# Patient Record
Sex: Male | Born: 1955 | Race: White | Hispanic: No | Marital: Married | Smoking: Former smoker
Health system: Southern US, Community
[De-identification: ages and names within clinical notes are randomized; demographics above are authoritative.]

## PROBLEM LIST (undated history)

## (undated) DIAGNOSIS — E079 Disorder of thyroid, unspecified: Secondary | ICD-10-CM

## (undated) DIAGNOSIS — E039 Hypothyroidism, unspecified: Secondary | ICD-10-CM

## (undated) DIAGNOSIS — M48061 Spinal stenosis, lumbar region without neurogenic claudication: Secondary | ICD-10-CM

## (undated) DIAGNOSIS — I2510 Atherosclerotic heart disease of native coronary artery without angina pectoris: Secondary | ICD-10-CM

## (undated) DIAGNOSIS — I219 Acute myocardial infarction, unspecified: Secondary | ICD-10-CM

## (undated) DIAGNOSIS — I10 Essential (primary) hypertension: Secondary | ICD-10-CM

## (undated) DIAGNOSIS — Z974 Presence of external hearing-aid: Secondary | ICD-10-CM

## (undated) DIAGNOSIS — T8859XA Other complications of anesthesia, initial encounter: Secondary | ICD-10-CM

## (undated) DIAGNOSIS — Z972 Presence of dental prosthetic device (complete) (partial): Secondary | ICD-10-CM

## (undated) DIAGNOSIS — T4145XA Adverse effect of unspecified anesthetic, initial encounter: Secondary | ICD-10-CM

## (undated) DIAGNOSIS — M1990 Unspecified osteoarthritis, unspecified site: Secondary | ICD-10-CM

## (undated) DIAGNOSIS — Z862 Personal history of diseases of the blood and blood-forming organs and certain disorders involving the immune mechanism: Secondary | ICD-10-CM

## (undated) HISTORY — PX: CORONARY ANGIOPLASTY WITH STENT PLACEMENT: SHX49

## (undated) HISTORY — PX: APPENDECTOMY: SHX54

## (undated) HISTORY — DX: Atherosclerotic heart disease of native coronary artery without angina pectoris: I25.10

---
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
## 1988-12-09 HISTORY — PX: SPLENECTOMY: SUR1306

## 1998-12-09 DIAGNOSIS — E0590 Thyrotoxicosis, unspecified without thyrotoxic crisis or storm: Secondary | ICD-10-CM

## 1998-12-09 HISTORY — DX: Thyrotoxicosis, unspecified without thyrotoxic crisis or storm: E05.90

## 2010-12-09 HISTORY — PX: CORONARY ANGIOPLASTY WITH STENT PLACEMENT: SHX49

## 2012-02-28 DIAGNOSIS — E785 Hyperlipidemia, unspecified: Secondary | ICD-10-CM | POA: Insufficient documentation

## 2012-11-02 DIAGNOSIS — M1712 Unilateral primary osteoarthritis, left knee: Secondary | ICD-10-CM | POA: Insufficient documentation

## 2012-11-02 DIAGNOSIS — M1710 Unilateral primary osteoarthritis, unspecified knee: Secondary | ICD-10-CM | POA: Insufficient documentation

## 2012-12-22 DIAGNOSIS — R079 Chest pain, unspecified: Secondary | ICD-10-CM | POA: Insufficient documentation

## 2014-01-04 DIAGNOSIS — Z96652 Presence of left artificial knee joint: Secondary | ICD-10-CM | POA: Insufficient documentation

## 2014-09-01 DIAGNOSIS — I2510 Atherosclerotic heart disease of native coronary artery without angina pectoris: Secondary | ICD-10-CM | POA: Insufficient documentation

## 2014-09-01 DIAGNOSIS — D693 Immune thrombocytopenic purpura: Secondary | ICD-10-CM | POA: Insufficient documentation

## 2014-09-01 DIAGNOSIS — N400 Enlarged prostate without lower urinary tract symptoms: Secondary | ICD-10-CM | POA: Insufficient documentation

## 2015-05-12 DIAGNOSIS — I83892 Varicose veins of left lower extremities with other complications: Secondary | ICD-10-CM | POA: Insufficient documentation

## 2015-09-27 DIAGNOSIS — D126 Benign neoplasm of colon, unspecified: Secondary | ICD-10-CM | POA: Insufficient documentation

## 2016-02-25 ENCOUNTER — Emergency Department
Admission: EM | Admit: 2016-02-25 | Discharge: 2016-02-26 | Disposition: A | Discharge status: Home / Self Care | Payer: PRIVATE HEALTH INSURANCE | Attending: Emergency Medicine | Admitting: Emergency Medicine

## 2016-02-25 ENCOUNTER — Emergency Department: Payer: PRIVATE HEALTH INSURANCE

## 2016-02-25 DIAGNOSIS — E079 Disorder of thyroid, unspecified: Secondary | ICD-10-CM | POA: Insufficient documentation

## 2016-02-25 DIAGNOSIS — Z8674 Personal history of sudden cardiac arrest: Secondary | ICD-10-CM | POA: Insufficient documentation

## 2016-02-25 DIAGNOSIS — Z79899 Other long term (current) drug therapy: Secondary | ICD-10-CM | POA: Insufficient documentation

## 2016-02-25 DIAGNOSIS — R079 Chest pain, unspecified: Secondary | ICD-10-CM | POA: Insufficient documentation

## 2016-02-25 DIAGNOSIS — F17210 Nicotine dependence, cigarettes, uncomplicated: Secondary | ICD-10-CM | POA: Diagnosis not present

## 2016-02-25 DIAGNOSIS — I10 Essential (primary) hypertension: Secondary | ICD-10-CM | POA: Diagnosis not present

## 2016-02-25 DIAGNOSIS — Z7982 Long term (current) use of aspirin: Secondary | ICD-10-CM | POA: Insufficient documentation

## 2016-02-25 DIAGNOSIS — Z955 Presence of coronary angioplasty implant and graft: Secondary | ICD-10-CM | POA: Diagnosis not present

## 2016-02-25 HISTORY — DX: Disorder of thyroid, unspecified: E07.9

## 2016-02-25 HISTORY — DX: Essential (primary) hypertension: I10

## 2016-02-25 LAB — CBC
HCT: 43.5 % (ref 40.0–52.0)
HEMOGLOBIN: 15.1 g/dL (ref 13.0–18.0)
MCH: 30.3 pg (ref 26.0–34.0)
MCHC: 34.7 g/dL (ref 32.0–36.0)
MCV: 87.4 fL (ref 80.0–100.0)
PLATELETS: 261 10*3/uL (ref 150–440)
RBC: 4.98 MIL/uL (ref 4.40–5.90)
RDW: 14.8 % — AB (ref 11.5–14.5)
WBC: 8.6 10*3/uL (ref 3.8–10.6)

## 2016-02-25 LAB — BASIC METABOLIC PANEL
Anion gap: 4 — ABNORMAL LOW (ref 5–15)
BUN: 16 mg/dL (ref 6–20)
CALCIUM: 9 mg/dL (ref 8.9–10.3)
CO2: 26 mmol/L (ref 22–32)
CREATININE: 1.25 mg/dL — AB (ref 0.61–1.24)
Chloride: 107 mmol/L (ref 101–111)
GFR calc Af Amer: >60 mL/min (ref >60)
GLUCOSE: 117 mg/dL — AB (ref 65–99)
Potassium: 3.6 mmol/L (ref 3.5–5.1)
Sodium: 137 mmol/L (ref 135–145)

## 2016-02-25 LAB — TROPONIN I

## 2016-02-25 LAB — PROTIME-INR
INR: 1.04
PROTHROMBIN TIME: 13.8 s (ref 11.4–15.0)

## 2016-02-25 NOTE — ED Notes (Signed)
Pt arrived from home via EMS c/o chest pain. Per EMS pt has hx of cardiac stent, and today was experiencing mid sternal chest pain that radiates to both arm, pt reported taking 3 nitro ans 2 baby aspirins with some relief. EMS reported giving 2 additional baby aspirins as well as 1 inch of nitro pace to the lower left abdomen.

## 2016-02-26 LAB — TROPONIN I

## 2016-02-26 NOTE — Discharge Instructions (Signed)
Given your previous heart attack history the recommendation would be for the patient to stay in the hospital. Please follow up with your cardiologist first thing in the morning for further evaluation. Please discuss with your cardiologist starting IMDUR as a medication to help with your chest pain.   Nonspecific Chest Pain  Chest pain can be caused by many different conditions. There is always a chance that your pain could be related to something serious, such as a heart attack or a blood clot in your lungs. Chest pain can also be caused by conditions that are not life-threatening. If you have chest pain, it is very important to follow up with your health care provider. CAUSES  Chest pain can be caused by:  Heartburn.  Pneumonia or bronchitis.  Anxiety or stress.  Inflammation around your heart (pericarditis) or lung (pleuritis or pleurisy).  A blood clot in your lung.  A collapsed lung (pneumothorax). It can develop suddenly on its own (spontaneous pneumothorax) or from trauma to the chest.  Shingles infection (varicella-zoster virus).  Heart attack.  Damage to the bones, muscles, and cartilage that make up your chest wall. This can include:  Bruised bones due to injury.  Strained muscles or cartilage due to frequent or repeated coughing or overwork.  Fracture to one or more ribs.  Sore cartilage due to inflammation (costochondritis). RISK FACTORS  Risk factors for chest pain may include:  Activities that increase your risk for trauma or injury to your chest.  Respiratory infections or conditions that cause frequent coughing.  Medical conditions or overeating that can cause heartburn.  Heart disease or family history of heart disease.  Conditions or health behaviors that increase your risk of developing a blood clot.  Having had chicken pox (varicella zoster). SIGNS AND SYMPTOMS Chest pain can feel like:  Burning or tingling on the surface of your chest or deep in  your chest.  Crushing, pressure, aching, or squeezing pain.  Dull or sharp pain that is worse when you move, cough, or take a deep breath.  Pain that is also felt in your back, neck, shoulder, or arm, or pain that spreads to any of these areas. Your chest pain may come and go, or it may stay constant. DIAGNOSIS Lab tests or other studies may be needed to find the cause of your pain. Your health care provider may have you take a test called an ambulatory ECG (electrocardiogram). An ECG records your heartbeat patterns at the time the test is performed. You may also have other tests, such as:  Transthoracic echocardiogram (TTE). During echocardiography, sound waves are used to create a picture of all of the heart structures and to look at how blood flows through your heart.  Transesophageal echocardiogram (TEE).This is a more advanced imaging test that obtains images from inside your body. It allows your health care provider to see your heart in finer detail.  Cardiac monitoring. This allows your health care provider to monitor your heart rate and rhythm in real time.  Holter monitor. This is a portable device that records your heartbeat and can help to diagnose abnormal heartbeats. It allows your health care provider to track your heart activity for several days, if needed.  Stress tests. These can be done through exercise or by taking medicine that makes your heart beat more quickly.  Blood tests.  Imaging tests. TREATMENT  Your treatment depends on what is causing your chest pain. Treatment may include:  Medicines. These may include:  Acid blockers  for heartburn.  Anti-inflammatory medicine.  Pain medicine for inflammatory conditions.  Antibiotic medicine, if an infection is present.  Medicines to dissolve blood clots.  Medicines to treat coronary artery disease.  Supportive care for conditions that do not require medicines. This may include:  Resting.  Applying heat or  cold packs to injured areas.  Limiting activities until pain decreases. HOME CARE INSTRUCTIONS  If you were prescribed an antibiotic medicine, finish it all even if you start to feel better.  Avoid any activities that bring on chest pain.  Do not use any tobacco products, including cigarettes, chewing tobacco, or electronic cigarettes. If you need help quitting, ask your health care provider.  Do not drink alcohol.  Take medicines only as directed by your health care provider.  Keep all follow-up visits as directed by your health care provider. This is important. This includes any further testing if your chest pain does not go away.  If heartburn is the cause for your chest pain, you may be told to keep your head raised (elevated) while sleeping. This reduces the chance that acid will go from your stomach into your esophagus.  Make lifestyle changes as directed by your health care provider. These may include:  Getting regular exercise. Ask your health care provider to suggest some activities that are safe for you.  Eating a heart-healthy diet. A registered dietitian can help you to learn healthy eating options.  Maintaining a healthy weight.  Managing diabetes, if necessary.  Reducing stress. SEEK MEDICAL CARE IF:  Your chest pain does not go away after treatment.  You have a rash with blisters on your chest.  You have a fever. SEEK IMMEDIATE MEDICAL CARE IF:   Your chest pain is worse.  You have an increasing cough, or you cough up blood.  You have severe abdominal pain.  You have severe weakness.  You faint.  You have chills.  You have sudden, unexplained chest discomfort.  You have sudden, unexplained discomfort in your arms, back, neck, or jaw.  You have shortness of breath at any time.  You suddenly start to sweat, or your skin gets clammy.  You feel nauseous or you vomit.  You suddenly feel light-headed or dizzy.  Your heart begins to beat quickly,  or it feels like it is skipping beats. These symptoms may represent a serious problem that is an emergency. Do not wait to see if the symptoms will go away. Get medical help right away. Call your local emergency services (911 in the [place] Do not drive yourself to the hospital.   This information is not intended to replace advice given to you by your health care provider. Make sure you discuss any questions you have with your health care provider.   Document Released: 09/04/2005 Document Revised: 12/16/2014 Document Reviewed: 07/01/2014 Elsevier Interactive Patient Education [employer].

## 2016-02-26 NOTE — ED Provider Notes (Signed)
[facility] Emergency Department Provider Note  ____________________________________________  Time seen: Approximately 2309 PM  I have reviewed the triage vital signs and the nursing notes.   HISTORY  Chief Complaint Chest Pain    HPI Jonanthan Andreola is a 60 y.o. male who comes into the hospital today with chest pain. The patient reports that the pain started around 7 PM. He was washing dishes when he started having some pressure in his chest and intense pain. He reports it was in his mid chest that went down his arms. He took 2 nitroglycerin as well as 281 mg aspirin. He reports that the pain eased but then he called EMS to get checked out. They did an EKG and he decided not to come into the hospital but 10 minutes later the pain started again. The patient took another nitroglycerin and decided to come into the hospital. EMS gave him 2 more 81 mg aspirin and put nitroglycerin paste on his abdomen. He reports that the pain is currently gone. The patient has had 2 previous MIs so he was concerned about the symptoms. His cardiologist is in [place]. He denies any nausea or vomiting no sweats no shortness of breath. He does have some mild shortness of breath but denies any pain when he takes a deep breath in. The patient's family were concerned about a heart attack and wanted him checked out.   Past Medical History  Diagnosis Date  . Hypertension   . Thyroid disease     There are no active problems to display for this patient.   Past Surgical History  Procedure Laterality Date  . Appendectomy    . Coronary angioplasty with stent placement      Current Outpatient Rx  Name  Route  Sig  Dispense  Refill  . clopidogrel (PLAVIX) 75 MG tablet   Oral   Take 1 tablet by mouth daily.      11   . doxazosin (CARDURA) 8 MG tablet   Oral   Take 1 tablet by mouth daily.      3   . levothyroxine (SYNTHROID, LEVOTHROID) 200 MCG tablet   Oral   Take 1 tablet by  mouth daily.      1   . lisinopril (PRINIVIL,ZESTRIL) 40 MG tablet   Oral   Take 1 tablet by mouth daily.      3   . metoprolol succinate (TOPROL-XL) 50 MG 24 hr tablet   Oral   Take 1 tablet by mouth daily.      6   . simvastatin (ZOCOR) 80 MG tablet   Oral   Take 0.5 tablets by mouth daily.      5   . spironolactone (ALDACTONE) 25 MG tablet   Oral   Take 1 tablet by mouth daily.      2   . traZODone (DESYREL) 50 MG tablet   Oral   Take 0.5 tablets by mouth daily.      11     Allergies Review of patient's allergies indicates no known allergies.  Family History  Problem Relation Age of Onset  . Stroke Mother   . Cancer Father     Social History Social History  Substance Use Topics  . Smoking status: Current Every Day Smoker -- 0.50 packs/day  . Smokeless tobacco: None  . Alcohol Use: No     Comment: rarely drinks    Review of Systems Constitutional: No fever/chills Eyes: No visual changes. ENT: No  sore throat. Cardiovascular:  chest pain. Respiratory: shortness of breath. Gastrointestinal: No abdominal pain.  No nausea, no vomiting.  No diarrhea.  No constipation. Genitourinary: Negative for dysuria. Musculoskeletal: Negative for back pain. Skin: Negative for rash. Neurological: Negative for headaches, focal weakness or numbness.  10-point ROS otherwise negative.  ____________________________________________   PHYSICAL EXAM:  VITAL SIGNS: ED Triage Vitals  Enc Vitals Group     BP 02/25/16 2130 138/71 mmHg     Pulse Rate 02/25/16 2109 65     Resp 02/25/16 2109 12     Temp 02/25/16 2109 98.4 F (36.9 C)     Temp src --      SpO2 02/25/16 2103 97 %     Weight 02/25/16 2109 272 lb (123.378 kg)     Height 02/25/16 2109 6\' 7"  (2.007 m)     Head Cir --      Peak Flow --      Pain Score 02/25/16 2112 1     Pain Loc --      Pain Edu? --      Excl. in [place]? --     Constitutional: Alert and oriented. Well appearing and in no acute  distress. Eyes: Conjunctivae are normal. PERRL. EOMI. Head: Atraumatic. Nose: No congestion/rhinnorhea. Mouth/Throat: Mucous membranes are moist.  Oropharynx non-erythematous. Cardiovascular: Normal rate, regular rhythm. Grossly normal heart sounds.  Good peripheral circulation. Respiratory: Normal respiratory effort.  No retractions. Lungs CTAB. Gastrointestinal: Soft and nontender. No distention. Positive bowel sounds Musculoskeletal: No lower extremity tenderness nor edema.   Neurologic:  Normal speech and language.  Skin:  Skin is warm, dry and intact.  Psychiatric: Mood and affect are normal.   ____________________________________________   LABS (all labs ordered are listed, but only abnormal results are displayed)  Labs Reviewed  BASIC METABOLIC PANEL - Abnormal; Notable for the following:    Glucose, Bld 117 (*)    Creatinine, Ser 1.25 (*)    Anion gap 4 (*)    All other components within normal limits  CBC - Abnormal; Notable for the following:    RDW 14.8 (*)    All other components within normal limits  TROPONIN I  PROTIME-INR  TROPONIN I   ____________________________________________  EKG  ED ECG REPORT I, Loney Hering, the attending physician, personally viewed and interpreted this ECG.   Date: 03/06/2016  EKG Time: 2109   Rate: 68  Rhythm: normal sinus rhythm  Axis: Left axis deviation  Intervals:none  ST&T Change: None  ____________________________________________  RADIOLOGY  Chest x-ray one emphysematous changes and chronic bronchitic changes in the lungs, no evidence of active pulmonary disease. ____________________________________________   PROCEDURES  Procedure(s) performed: None  Critical Care performed: No  ____________________________________________   INITIAL IMPRESSION / ASSESSMENT AND PLAN / ED COURSE  Pertinent labs & imaging results that were available during my care of the patient were reviewed by me and considered in  my medical decision making (see chart for details).  This is a 60 year old male with a history of previous heart attack who comes into the hospital today with some chest pain. The patient's pain started at 7 when away after some nitroglycerin but came back. He does have a Nitropaste on it this time. The patient's initial blood work is unremarkable and I did inform him given his history and his previous heart attacks I would consider admitting him to the hospital. The patient reports at this point he really doesn't want to be admitted. We will  continue with the second troponin at this time and then determine the patient's appropriate disposition. He reports that he will think about being admitted to the hospital.  The patient's repeat troponin was unremarkable. I discussed again with the patient staying in the hospital but he reports that he would be more comfortable going home and he would rather follow-up with his cardiologist. The patient will have his Nitropaste removed and he'll be discharged to home. I did encourage him that should he have any more pain he should return he would need to be admitted at that time. The patient be discharged home. ____________________________________________   FINAL CLINICAL IMPRESSION(S) / ED DIAGNOSES  Final diagnoses:  Chest pain, unspecified chest pain type      Loney Hering, MD 02/26/16 [phone]

## 2016-08-27 DIAGNOSIS — D229 Melanocytic nevi, unspecified: Secondary | ICD-10-CM | POA: Insufficient documentation

## 2017-04-14 DIAGNOSIS — Z85828 Personal history of other malignant neoplasm of skin: Secondary | ICD-10-CM | POA: Insufficient documentation

## 2017-04-14 DIAGNOSIS — E669 Obesity, unspecified: Secondary | ICD-10-CM | POA: Insufficient documentation

## 2017-12-05 DIAGNOSIS — Z955 Presence of coronary angioplasty implant and graft: Secondary | ICD-10-CM | POA: Insufficient documentation

## 2018-03-04 DIAGNOSIS — G5603 Carpal tunnel syndrome, bilateral upper limbs: Secondary | ICD-10-CM | POA: Insufficient documentation

## 2018-06-09 DIAGNOSIS — J3489 Other specified disorders of nose and nasal sinuses: Secondary | ICD-10-CM | POA: Insufficient documentation

## 2018-07-10 DIAGNOSIS — G5623 Lesion of ulnar nerve, bilateral upper limbs: Secondary | ICD-10-CM | POA: Insufficient documentation

## 2018-09-04 HISTORY — PX: SEPTOPLASTY: SUR1290

## 2018-09-14 DIAGNOSIS — M1612 Unilateral primary osteoarthritis, left hip: Secondary | ICD-10-CM | POA: Insufficient documentation

## 2019-01-26 DIAGNOSIS — J0101 Acute recurrent maxillary sinusitis: Secondary | ICD-10-CM | POA: Insufficient documentation

## 2019-05-04 DIAGNOSIS — Z9081 Acquired absence of spleen: Secondary | ICD-10-CM | POA: Insufficient documentation

## 2020-01-13 ENCOUNTER — Encounter: Payer: Self-pay | Admitting: Gastroenterology

## 2020-01-13 ENCOUNTER — Other Ambulatory Visit: Payer: Self-pay

## 2020-01-13 ENCOUNTER — Ambulatory Visit: Payer: Medicare Other | Admitting: Gastroenterology

## 2020-01-13 VITALS — BP 118/70 | HR 58 | Temp 97.7°F | Ht 79.0 in | Wt 271.4 lb

## 2020-01-13 DIAGNOSIS — E039 Hypothyroidism, unspecified: Secondary | ICD-10-CM | POA: Insufficient documentation

## 2020-01-13 DIAGNOSIS — R1310 Dysphagia, unspecified: Secondary | ICD-10-CM

## 2020-01-13 DIAGNOSIS — R1319 Other dysphagia: Secondary | ICD-10-CM

## 2020-01-13 DIAGNOSIS — H903 Sensorineural hearing loss, bilateral: Secondary | ICD-10-CM | POA: Insufficient documentation

## 2020-01-13 NOTE — Progress Notes (Signed)
Gastroenterology Consultation  Referring Provider:     Betsy Pries, MD Primary Care Physician:  Betsy Pries, MD Primary Gastroenterologist:  Dr. Allen Norris     Reason for Consultation:     Dysphagia        HPI:   Ryan Roth is a 64 y.o. y/o male referred for consultation & management of dysphagia by Dr. Charise Killian, Cyndie Mull, MD.  This patient comes in today with a report of dysphagia.  Patient states he has been having dysphagia for many years and he notices it to have been getting worse recently.  He denies it to be any worse with solids or liquids.  The patient does have a daughter who had an esophageal stricture that was dilated with a balloon.  He had another daughter who had lupus and had passed at the age of 1 from lupus and had multiple GI symptoms.  The patient denies any unexplained weight loss fevers chills nausea or vomiting.  He states that he has had a colonoscopy in the past and is not due for another one.  The patient denies any abdominal pain associated with his dysphagia.  There is also no report of any family history of colon cancer or colon polyps. He does report that on imaging in the past he has been told that he has a hiatal hernia.  Past Medical History:  Diagnosis Date  . Hypertension   . Thyroid disease     Past Surgical History:  Procedure Laterality Date  . APPENDECTOMY    . CORONARY ANGIOPLASTY WITH STENT PLACEMENT      Prior to Admission medications   Medication Sig Start Date End Date Taking? Authorizing Provider  aspirin 81 MG chewable tablet Chew by mouth.   Yes [provider]  clopidogrel (PLAVIX) 75 MG tablet Take 1 tablet by mouth daily. 02/21/16  Yes [provider]  cycloSPORINE (RESTASIS) 0.05 % ophthalmic emulsion Place one drop into both eyes 2 (two) times daily. 11/16/19  Yes [provider]  doxazosin (CARDURA) 8 MG tablet Take 1 tablet by mouth daily. 12/19/15  Yes [provider]  fluticasone  (FLONASE) 50 MCG/ACT nasal spray 2 sprays into each nostril once daily or 1 spray into each nostril twice daily. 06/09/18  Yes [provider]  gabapentin (NEURONTIN) 300 MG capsule Take by mouth. 02/09/18  Yes [provider]  hydrochlorothiazide (HYDRODIURIL) 25 MG tablet Take by mouth. 11/03/18  Yes [provider]  Ipratropium-Albuterol (COMBIVENT) 20-100 MCG/ACT AERS respimat Inhale into the lungs. 11/16/19  Yes [provider]  levothyroxine (SYNTHROID) 150 MCG tablet Take 150 mcg by mouth daily before breakfast.   Yes [provider]  lisinopril (PRINIVIL,ZESTRIL) 40 MG tablet Take 1 tablet by mouth daily. 12/08/15  Yes [provider]  meloxicam (MOBIC) 15 MG tablet Take 15 mg by mouth daily. 10/07/19  Yes [provider]  methocarbamol (ROBAXIN) 750 MG tablet Take by mouth.   Yes [provider]  metoprolol succinate (TOPROL-XL) 50 MG 24 hr tablet Take 1 tablet by mouth daily. 02/21/16  Yes [provider]  naproxen sodium (ALEVE) 220 MG tablet Take by mouth.   Yes [provider]  nitroGLYCERIN (NITROSTAT) 0.4 MG SL tablet Place under the tongue. 02/26/16  Yes [provider]  rosuvastatin (CRESTOR) 20 MG tablet Take 20 mg by mouth at bedtime. 12/06/19  Yes [provider]  simvastatin (ZOCOR) 80 MG tablet Take 0.5 tablets by mouth daily. 01/24/16  Yes [provider]  spironolactone (ALDACTONE) 25 MG tablet Take 1 tablet by mouth daily. 12/19/15  Yes [provider]  traZODone (DESYREL) 50 MG tablet Take 0.5 tablets by mouth daily. 02/07/16  Yes [provider]    Family History  Problem Relation Age of Onset  . Stroke Mother   . Cancer Father      Social History   Tobacco Use  . Smoking status: Former Smoker    Packs/day: 0.50  . Smokeless tobacco: Never Used  Substance Use Topics  . Alcohol use: No    Comment: rarely drinks  . Drug use: Never     Allergies as of 01/13/2020 - Review Complete 01/13/2020  Allergen Reaction Noted  . Cefazolin Itching 04/28/2014    Review of Systems:    All systems reviewed and negative except where noted in HPI.   Physical Exam:  BP 118/70   Pulse (!) 58   Temp 97.7 F (36.5 C) (Oral)   Ht 6\' 7"  (2.007 m)   Wt 271 lb 6.4 oz (123.1 kg)   BMI 30.57 kg/m  No LMP for male patient. General:   Alert,  Well-developed, well-nourished, pleasant and cooperative in NAD Head:  Normocephalic and atraumatic. Eyes:  Sclera clear, no icterus.   Conjunctiva pink. Ears:  Normal auditory acuity. Neck:  Supple; no masses or thyromegaly. Lungs:  Respirations even and unlabored.  Clear throughout to auscultation.   No wheezes, crackles, or rhonchi. No acute distress. Heart:  Regular rate and rhythm; no murmurs, clicks, rubs, or gallops. Abdomen:  Normal bowel sounds.  No bruits.  Soft, non-tender and non-distended without masses, hepatosplenomegaly or hernias noted.  No guarding or rebound tenderness.  Negative Carnett sign.   Rectal:  Deferred.  Pulses:  Normal pulses noted. Extremities:  No clubbing or edema.  No cyanosis. Neurologic:  Alert and oriented x3;  grossly normal neurologically. Skin:  Intact without significant lesions or rashes.  No jaundice. Lymph Nodes:  No significant cervical adenopathy. Psych:  Alert and cooperative. Normal mood and affect.  Imaging Studies: No results found.  Assessment and Plan:   Jovi Asis is a 64 y.o. y/o male who comes in today with a history of dysphagia.  The patient dysphagia has been going on for some time without any worry symptoms such as weight loss black stools bloody stools or hematemesis.  The patient has also not been on any acid suppression medication.  The patient will be set up for an EGD.  If there is any sign of damage from reflux he may need to be put on a PPI.  If there is no strictures or narrowing and eosinophilic esophagitis should be  considered as a possible diagnosis.  The patient also appears to have had a colonoscopy in 2016 with an adenomatous polyp at that time.  The patient has been explained the plan and agrees with it.    Lucilla Lame, MD. Marval Regal    Note: This dictation was prepared with Dragon dictation along with smaller phrase technology. Any transcriptional errors that result from this process are unintentional.

## 2020-01-26 ENCOUNTER — Other Ambulatory Visit: Payer: Self-pay

## 2020-01-26 DIAGNOSIS — R1310 Dysphagia, unspecified: Secondary | ICD-10-CM

## 2020-01-26 DIAGNOSIS — R1319 Other dysphagia: Secondary | ICD-10-CM

## 2020-02-15 ENCOUNTER — Encounter: Payer: Self-pay | Admitting: Gastroenterology

## 2020-02-15 ENCOUNTER — Other Ambulatory Visit: Payer: Self-pay

## 2020-02-17 ENCOUNTER — Other Ambulatory Visit
Admission: RE | Admit: 2020-02-17 | Discharge: 2020-02-17 | Disposition: A | Discharge status: Home / Self Care | Payer: Medicare Other | Source: Ambulatory Visit | Attending: Gastroenterology | Admitting: Gastroenterology

## 2020-02-17 ENCOUNTER — Other Ambulatory Visit: Payer: Self-pay

## 2020-02-17 DIAGNOSIS — Z20822 Contact with and (suspected) exposure to covid-19: Secondary | ICD-10-CM | POA: Diagnosis present

## 2020-02-17 LAB — SARS CORONAVIRUS 2 (TAT 6-24 HRS): SARS Coronavirus 2: NEGATIVE

## 2020-02-17 NOTE — Discharge Instructions (Signed)
General Anesthesia, Adult, Care After This sheet gives you information about how to care for yourself after your procedure. Your health care provider may also give you more specific instructions. If you have problems or questions, contact your health care provider. What can I expect after the procedure? After the procedure, the following side effects are common:  Pain or discomfort at the IV site.  Nausea.  Vomiting.  Sore throat.  Trouble concentrating.  Feeling cold or chills.  Weak or tired.  Sleepiness and fatigue.  Soreness and body aches. These side effects can affect parts of the body that were not involved in surgery. Follow these instructions at home:  For at least 24 hours after the procedure:  Have a responsible adult stay with you. It is important to have someone help care for you until you are awake and alert.  Rest as needed.  Do not: ? Participate in activities in which you could fall or become injured. ? Drive. ? Use heavy machinery. ? Drink alcohol. ? Take sleeping pills or medicines that cause drowsiness. ? Make important decisions or sign legal documents. ? Take care of children on your own. Eating and drinking  Follow any instructions from your health care provider about eating or drinking restrictions.  When you feel hungry, start by eating small amounts of foods that are soft and easy to digest (bland), such as toast. Gradually return to your regular diet.  Drink enough fluid to keep your urine pale yellow.  If you vomit, rehydrate by drinking water, juice, or clear broth. General instructions  If you have sleep apnea, surgery and certain medicines can increase your risk for breathing problems. Follow instructions from your health care provider about wearing your sleep device: ? Anytime you are sleeping, including during daytime naps. ? While taking prescription pain medicines, sleeping medicines, or medicines that make you drowsy.  Return to  your normal activities as told by your health care provider. Ask your health care provider what activities are safe for you.  Take over-the-counter and prescription medicines only as told by your health care provider.  If you smoke, do not smoke without supervision.  Keep all follow-up visits as told by your health care provider. This is important. Contact a health care provider if:  You have nausea or vomiting that does not get better with medicine.  You cannot eat or drink without vomiting.  You have pain that does not get better with medicine.  You are unable to pass urine.  You develop a skin rash.  You have a fever.  You have redness around your IV site that gets worse. Get help right away if:  You have difficulty breathing.  You have chest pain.  You have blood in your urine or stool, or you vomit blood. Summary  After the procedure, it is common to have a sore throat or nausea. It is also common to feel tired.  Have a responsible adult stay with you for the first 24 hours after general anesthesia. It is important to have someone help care for you until you are awake and alert.  When you feel hungry, start by eating small amounts of foods that are soft and easy to digest (bland), such as toast. Gradually return to your regular diet.  Drink enough fluid to keep your urine pale yellow.  Return to your normal activities as told by your health care provider. Ask your health care provider what activities are safe for you. This information is not   intended to replace advice given to you by your health care provider. Make sure you discuss any questions you have with your health care provider. Document Revised: 11/28/2017 Document Reviewed: 07/11/2017 Elsevier Patient Education  2020 [employer]  

## 2020-02-21 ENCOUNTER — Ambulatory Visit
Admission: RE | Admit: 2020-02-21 | Discharge: 2020-02-21 | Disposition: A | Discharge status: Home / Self Care | Payer: Medicare Other | Attending: Gastroenterology | Admitting: Gastroenterology

## 2020-02-21 ENCOUNTER — Encounter
Admission: RE | Disposition: A | Discharge status: Home / Self Care | Payer: Self-pay | Source: Home / Self Care | Attending: Gastroenterology

## 2020-02-21 ENCOUNTER — Ambulatory Visit: Payer: Medicare Other | Admitting: Anesthesiology

## 2020-02-21 ENCOUNTER — Other Ambulatory Visit: Payer: Self-pay

## 2020-02-21 ENCOUNTER — Encounter: Payer: Self-pay | Admitting: Gastroenterology

## 2020-02-21 DIAGNOSIS — Z955 Presence of coronary angioplasty implant and graft: Secondary | ICD-10-CM | POA: Insufficient documentation

## 2020-02-21 DIAGNOSIS — I252 Old myocardial infarction: Secondary | ICD-10-CM | POA: Diagnosis not present

## 2020-02-21 DIAGNOSIS — M1990 Unspecified osteoarthritis, unspecified site: Secondary | ICD-10-CM | POA: Insufficient documentation

## 2020-02-21 DIAGNOSIS — K449 Diaphragmatic hernia without obstruction or gangrene: Secondary | ICD-10-CM | POA: Insufficient documentation

## 2020-02-21 DIAGNOSIS — Z9081 Acquired absence of spleen: Secondary | ICD-10-CM | POA: Insufficient documentation

## 2020-02-21 DIAGNOSIS — M48061 Spinal stenosis, lumbar region without neurogenic claudication: Secondary | ICD-10-CM | POA: Insufficient documentation

## 2020-02-21 DIAGNOSIS — Z79899 Other long term (current) drug therapy: Secondary | ICD-10-CM | POA: Insufficient documentation

## 2020-02-21 DIAGNOSIS — E669 Obesity, unspecified: Secondary | ICD-10-CM | POA: Insufficient documentation

## 2020-02-21 DIAGNOSIS — Z791 Long term (current) use of non-steroidal anti-inflammatories (NSAID): Secondary | ICD-10-CM | POA: Diagnosis not present

## 2020-02-21 DIAGNOSIS — K222 Esophageal obstruction: Secondary | ICD-10-CM | POA: Diagnosis not present

## 2020-02-21 DIAGNOSIS — Z809 Family history of malignant neoplasm, unspecified: Secondary | ICD-10-CM | POA: Insufficient documentation

## 2020-02-21 DIAGNOSIS — R1310 Dysphagia, unspecified: Secondary | ICD-10-CM | POA: Diagnosis not present

## 2020-02-21 DIAGNOSIS — Z823 Family history of stroke: Secondary | ICD-10-CM | POA: Diagnosis not present

## 2020-02-21 DIAGNOSIS — Z7982 Long term (current) use of aspirin: Secondary | ICD-10-CM | POA: Insufficient documentation

## 2020-02-21 DIAGNOSIS — E039 Hypothyroidism, unspecified: Secondary | ICD-10-CM | POA: Insufficient documentation

## 2020-02-21 DIAGNOSIS — Z87891 Personal history of nicotine dependence: Secondary | ICD-10-CM | POA: Insufficient documentation

## 2020-02-21 DIAGNOSIS — I10 Essential (primary) hypertension: Secondary | ICD-10-CM | POA: Diagnosis not present

## 2020-02-21 DIAGNOSIS — Z6830 Body mass index (BMI) 30.0-30.9, adult: Secondary | ICD-10-CM | POA: Diagnosis not present

## 2020-02-21 HISTORY — DX: Personal history of diseases of the blood and blood-forming organs and certain disorders involving the immune mechanism: Z86.2

## 2020-02-21 HISTORY — DX: Other complications of anesthesia, initial encounter: T88.59XA

## 2020-02-21 HISTORY — PX: BALLOON DILATION: SHX5330

## 2020-02-21 HISTORY — DX: Spinal stenosis, lumbar region without neurogenic claudication: M48.061

## 2020-02-21 HISTORY — PX: ESOPHAGOGASTRODUODENOSCOPY (EGD) WITH PROPOFOL: SHX5813

## 2020-02-21 HISTORY — DX: Presence of external hearing-aid: Z97.4

## 2020-02-21 HISTORY — DX: Presence of dental prosthetic device (complete) (partial): Z97.2

## 2020-02-21 HISTORY — DX: Hypothyroidism, unspecified: E03.9

## 2020-02-21 HISTORY — DX: Acute myocardial infarction, unspecified: I21.9

## 2020-02-21 HISTORY — DX: Unspecified osteoarthritis, unspecified site: M19.90

## 2020-02-21 SURGERY — ESOPHAGOGASTRODUODENOSCOPY (EGD) WITH PROPOFOL
Anesthesia: General | Site: Throat

## 2020-02-21 MED ORDER — ONDANSETRON HCL 4 MG/2ML IJ SOLN: 4.0000 mg | Freq: Once | INTRAMUSCULAR | Status: DC | PRN

## 2020-02-21 MED ORDER — LIDOCAINE HCL (CARDIAC) PF 100 MG/5ML IV SOSY
PREFILLED_SYRINGE | INTRAVENOUS | Status: DC | PRN
  Administered 2020-02-21: 30 mg via INTRAVENOUS

## 2020-02-21 MED ORDER — PROPOFOL 10 MG/ML IV BOLUS
INTRAVENOUS | Status: DC | PRN
  Administered 2020-02-21: 150 mg via INTRAVENOUS
  Administered 2020-02-21: 20 mg via INTRAVENOUS
  Administered 2020-02-21: 2 mg via INTRAVENOUS
  Administered 2020-02-21: 30 mg via INTRAVENOUS

## 2020-02-21 MED ORDER — GLYCOPYRROLATE 0.2 MG/ML IJ SOLN
INTRAMUSCULAR | Status: DC | PRN
  Administered 2020-02-21: .1 mg via INTRAVENOUS

## 2020-02-21 MED ORDER — LACTATED RINGERS IV SOLN: INTRAVENOUS | Status: DC

## 2020-02-21 SURGICAL SUPPLY — 33 items
BALLN DILATOR 10-12 8 (BALLOONS)
BALLN DILATOR 12-15 8 (BALLOONS)
BALLN DILATOR 15-18 8 (BALLOONS) ×3
BALLN DILATOR CRE 0-12 8 (BALLOONS)
BALLN DILATOR ESOPH 8 10 CRE (MISCELLANEOUS) IMPLANT
BALLOON DILATOR 12-15 8 (BALLOONS) IMPLANT
BALLOON DILATOR 15-18 8 (BALLOONS) ×1 IMPLANT
BALLOON DILATOR CRE 0-12 8 (BALLOONS) IMPLANT
BLOCK BITE 60FR ADLT L/F GRN (MISCELLANEOUS) ×3 IMPLANT
CANISTER SUCT 1200ML W/VALVE (MISCELLANEOUS) ×3 IMPLANT
CLIP HMST 235XBRD CATH ROT (MISCELLANEOUS) IMPLANT
CLIP RESOLUTION 360 11X235 (MISCELLANEOUS)
ELECT REM PT RETURN 9FT ADLT (ELECTROSURGICAL)
ELECTRODE REM PT RTRN 9FT ADLT (ELECTROSURGICAL) IMPLANT
FCP ESCP3.2XJMB 240X2.8X (MISCELLANEOUS)
FORCEPS BIOP RAD 4 LRG CAP 4 (CUTTING FORCEPS) IMPLANT
FORCEPS BIOP RJ4 240 W/NDL (MISCELLANEOUS)
FORCEPS ESCP3.2XJMB 240X2.8X (MISCELLANEOUS) IMPLANT
GOWN CVR UNV OPN BCK APRN NK (MISCELLANEOUS) ×2 IMPLANT
GOWN ISOL THUMB LOOP REG UNIV (MISCELLANEOUS) ×4
INJECTOR VARIJECT VIN23 (MISCELLANEOUS) IMPLANT
KIT DEFENDO VALVE AND CONN (KITS) IMPLANT
KIT ENDO PROCEDURE OLY (KITS) ×3 IMPLANT
MARKER SPOT ENDO TATTOO 5ML (MISCELLANEOUS) IMPLANT
RETRIEVER NET PLAT FOOD (MISCELLANEOUS) IMPLANT
SNARE SHORT THROW 13M SML OVAL (MISCELLANEOUS) IMPLANT
SNARE SHORT THROW 30M LRG OVAL (MISCELLANEOUS) IMPLANT
SPOT EX ENDOSCOPIC TATTOO (MISCELLANEOUS)
SYR INFLATION 60ML (SYRINGE) ×3 IMPLANT
TRAP ETRAP POLY (MISCELLANEOUS) IMPLANT
VARIJECT INJECTOR VIN23 (MISCELLANEOUS)
WATER STERILE IRR 250ML POUR (IV SOLUTION) ×3 IMPLANT
WIRE CRE 18-20MM 8CM F G (MISCELLANEOUS) IMPLANT

## 2020-02-21 NOTE — Op Note (Signed)
[facility] Patient Name: Ryan Roth Procedure Date: 02/21/2020 9:40 AM MRN: [id number] Account #: [id number] Date of Birth: [birth year] Admit Type: Outpatient Age: 64 Room: [facility] OR ROOM 01 Gender: Male Note Status: Finalized Procedure:             Upper GI endoscopy Indications:           Dysphagia Providers:             Lucilla Lame MD, MD Referring MD:          Betsy Pries (Referring MD) Medicines:             Propofol per Anesthesia Complications:         No immediate complications. Procedure:             Pre-Anesthesia Assessment:                        - Prior to the procedure, a History and Physical was                         performed, and patient medications and allergies were                         reviewed. The patient's tolerance of previous                         anesthesia was also reviewed. The risks and benefits                         of the procedure and the sedation options and risks                         were discussed with the patient. All questions were                         answered, and informed consent was obtained. Prior                         Anticoagulants: The patient has taken no previous                         anticoagulant or antiplatelet agents. ASA Grade                         Assessment: II - A patient with mild systemic disease.                         After reviewing the risks and benefits, the patient                         was deemed in satisfactory condition to undergo the                         procedure.                        After obtaining informed consent, the endoscope was  passed under direct vision. Throughout the procedure,                         the patient's blood pressure, pulse, and oxygen                         saturations were monitored continuously. The was                         introduced through the mouth, and advanced to the        second part of duodenum. The upper GI endoscopy was                         accomplished without difficulty. The patient tolerated                         the procedure well. Findings:      A small hiatal hernia was present.      One benign-appearing, intrinsic mild stenosis was found at the       gastroesophageal junction. The stenosis was traversed. A TTS dilator was       passed through the scope. Dilation with a 15-16.5-18 mm balloon dilator       was performed to 18 mm.      The stomach was normal.      The examined duodenum was normal. Impression:            - Small hiatal hernia.                        - Benign-appearing esophageal stenosis. Dilated.                        - Normal stomach.                        - Normal examined duodenum.                        - No specimens collected. Recommendation:        - Discharge patient to home.                        - Resume previous diet.                        - Continue present medications. Procedure Code(s):     --- Professional ---                        [phone], Esophagogastroduodenoscopy, flexible,                         transoral; with transendoscopic balloon dilation of                         esophagus (less than 30 mm diameter) Diagnosis Code(s):     --- Professional ---                        R13.10, Dysphagia, unspecified  K22.2, Esophageal obstruction CPT copyright 2019 American Medical Association. All rights reserved. The codes documented in this report are preliminary and upon coder review may  be revised to meet current compliance requirements. Lucilla Lame MD, MD 02/21/2020 10:00:08 AM This report has been signed electronically. Number of Addenda: 0 Note Initiated On: 02/21/2020 9:40 AM Total Procedure Duration: 0 hours 4 minutes 27 seconds  Estimated Blood Loss:  Estimated blood loss: none.      [facility]

## 2020-02-21 NOTE — Transfer of Care (Signed)
Immediate Anesthesia Transfer of Care Note  Patient: Ryan Roth  Procedure(s) Performed: ESOPHAGOGASTRODUODENOSCOPY (EGD) WITH PROPOFOL (N/A Throat) BALLOON DILATION (N/A Throat)  Patient Location: PACU  Anesthesia Type: General  Level of Consciousness: awake, alert  and patient cooperative  Airway and Oxygen Therapy: Patient Spontanous Breathing and Patient connected to supplemental oxygen  Post-op Assessment: Post-op Vital signs reviewed, Patient's Cardiovascular Status Stable, Respiratory Function Stable, Patent Airway and No signs of Nausea or vomiting  Post-op Vital Signs: Reviewed and stable  Complications: No apparent anesthesia complications

## 2020-02-21 NOTE — Anesthesia Preprocedure Evaluation (Signed)
Anesthesia Evaluation  Patient identified by MRN, date of birth, ID band Patient awake    History of Anesthesia Complications Negative for: history of anesthetic complications  Airway Mallampati: II  TM Distance: >3 FB Neck ROM: Full    Dental no notable dental hx.    Pulmonary sleep apnea (presumed) , former smoker,    Pulmonary exam normal        Cardiovascular Exercise Tolerance: Good hypertension, Pt. on medications and Pt. on home beta blockers (-) angina (no need for nitro x4 years)+ CAD, + Past MI and + Cardiac Stents (stent x1 '12, another '14)  Normal cardiovascular exam     Neuro/Psych negative neurological ROS     GI/Hepatic negative GI ROS, Neg liver ROS,   Endo/Other  Hypothyroidism Obese BMI 30  Renal/GU negative Renal ROS     Musculoskeletal negative musculoskeletal ROS (+)   Abdominal   Peds  Hematology   Anesthesia Other Findings   Reproductive/Obstetrics                            Anesthesia Physical Anesthesia Plan  ASA: III  Anesthesia Plan: General   Post-op Pain Management:    Induction: Intravenous  PONV Risk Score and Plan: 2 and Propofol infusion, TIVA and Treatment may vary due to age or medical condition  Airway Management Planned: Nasal Cannula and Natural Airway  Additional Equipment: None  Intra-op Plan:   Post-operative Plan:   Informed Consent: I have reviewed the patients History and Physical, chart, labs and discussed the procedure including the risks, benefits and alternatives for the proposed anesthesia with the patient or authorized representative who has indicated his/her understanding and acceptance.       Plan Discussed with: CRNA  Anesthesia Plan Comments:         Anesthesia Quick Evaluation

## 2020-02-21 NOTE — H&P (Signed)
Lucilla Lame, MD [facility] [address], [place], [place] [address] Phone:[phone] Fax : [phone]  Primary Care Physician:  Betsy Pries, MD Primary Gastroenterologist:  Dr. Allen Norris  Pre-Procedure History & Physical: HPI:  Ryan Roth is a 64 y.o. male is here for an endoscopy.   Past Medical History:  Diagnosis Date  . Arthritis   . Complication of anesthesia    BP "bottomed out" during septoplasty  . Dental bridge present    lower right  . 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 Hypertension   . Hyperthyroidism 2000   Radioactive Iodine Treatments  . Hypothyroidism   . Lumbar stenosis   . Myocardial infarction ([place])    2012, 2014  . Thyroid disease   . Wears hearing aid in both ears    Has, does not wear    Past Surgical History:  Procedure Laterality Date  . APPENDECTOMY    . CORONARY ANGIOPLASTY WITH STENT PLACEMENT  2012  . SEPTOPLASTY  09/04/2018   [facility]  . SPLENECTOMY  1990    Prior to Admission medications   Medication Sig Start Date End Date Taking? Authorizing Provider  aspirin 81 MG chewable tablet Chew by mouth.   Yes [provider]  doxazosin (CARDURA) 8 MG tablet Take 1 tablet by mouth daily. 12/19/15  Yes [provider]  fluticasone (FLONASE) 50 MCG/ACT nasal spray 2 sprays into each nostril once daily or 1 spray into each nostril twice daily. 06/09/18  Yes [provider]  levothyroxine (SYNTHROID) 150 MCG tablet Take 150 mcg by mouth daily before breakfast.   Yes [provider]  lisinopril (PRINIVIL,ZESTRIL) 40 MG tablet Take 1 tablet by mouth daily. 12/08/15  Yes [provider]  metoprolol succinate (TOPROL-XL) 50 MG 24 hr tablet Take 1 tablet by mouth daily. 02/21/16  Yes [provider]  nitroGLYCERIN (NITROSTAT) 0.4 MG SL tablet Place under the tongue. 02/26/16  Yes [provider]  rosuvastatin (CRESTOR) 20 MG tablet Take 20 mg by mouth at bedtime. 12/06/19   Yes [provider]  spironolactone (ALDACTONE) 25 MG tablet Take 1 tablet by mouth daily. 12/19/15  Yes [provider]  cycloSPORINE (RESTASIS) 0.05 % ophthalmic emulsion Place one drop into both eyes 2 (two) times daily. 11/16/19   [provider]  hydrochlorothiazide (HYDRODIURIL) 25 MG tablet Take by mouth. 11/03/18   [provider]  Ipratropium-Albuterol (COMBIVENT) 20-100 MCG/ACT AERS respimat Inhale into the lungs. 11/16/19   [provider]  meloxicam (MOBIC) 15 MG tablet Take 15 mg by mouth daily. 10/07/19   [provider]  methocarbamol (ROBAXIN) 750 MG tablet Take by mouth.    [provider]    Allergies as of 01/26/2020 - Review Complete 01/13/2020  Allergen Reaction Noted  . Cefazolin Itching 04/28/2014    Family History  Problem Relation Age of Onset  . Stroke Mother   . Cancer Father     Social History   Socioeconomic History  . Marital status: Married    Spouse name: Not on file  . Number of children: Not on file  . Years of education: Not on file  . Highest education level: Not on file  Occupational History  . Not on file  Tobacco Use  . Smoking status: Former Smoker    Packs/day: 0.50    Quit date: 12/09/2017    Years since quitting: 2.2  . Smokeless tobacco: Never Used  Substance and Sexual Activity  .  Alcohol use: No    Comment: rarely drinks - may have couple drinks/month  . Drug use: Never  . Sexual activity: Not on file  Other Topics Concern  . Not on file  Social History Narrative  . Not on file   Social Determinants of Health   Financial Resource Strain:   . Difficulty of Paying Living Expenses:   Food Insecurity:   . Worried About Charity fundraiser in the Last Year:   . Arboriculturist in the Last Year:   Transportation Needs:   . Film/video editor (Medical):   Marland Kitchen Lack of Transportation (Non-Medical):   Physical Activity:   . Days of Exercise per Week:   . Minutes of  Exercise per Session:   Stress:   . Feeling of Stress :   Social Connections:   . Frequency of Communication with Friends and Family:   . Frequency of Social Gatherings with Friends and Family:   . Attends Religious Services:   . Active Member of Clubs or Organizations:   . Attends Archivist Meetings:   Marland Kitchen Marital Status:   Intimate Partner Violence:   . Fear of Current or Ex-Partner:   . Emotionally Abused:   Marland Kitchen Physically Abused:   . Sexually Abused:     Review of Systems: See HPI, otherwise negative ROS  Physical Exam: BP 127/78   Pulse (!) 56   Temp 97.8 F (36.6 C) (Temporal)   Resp 16   Ht 6\' 7"  (2.007 m)   Wt 121.1 kg   SpO2 97%   BMI 30.08 kg/m  General:   Alert,  pleasant and cooperative in NAD Head:  Normocephalic and atraumatic. Neck:  Supple; no masses or thyromegaly. Lungs:  Clear throughout to auscultation.    Heart:  Regular rate and rhythm. Abdomen:  Soft, nontender and nondistended. Normal bowel sounds, without guarding, and without rebound.   Neurologic:  Alert and  oriented x4;  grossly normal neurologically.  Impression/Plan: Ryan Roth is here for an endoscopy to be performed for dysphagia  Risks, benefits, limitations, and alternatives regarding  endoscopy have been reviewed with the patient.  Questions have been answered.  All parties agreeable.   Lucilla Lame, MD  02/21/2020, 9:24 AM

## 2020-02-21 NOTE — Anesthesia Procedure Notes (Signed)
Date/Time: 02/21/2020 9:50 AM Performed by: Cameron Ali, CRNA Pre-anesthesia Checklist: Patient identified, Emergency Drugs available, Suction available, Timeout performed and Patient being monitored Patient Re-evaluated:Patient Re-evaluated prior to induction Oxygen Delivery Method: Nasal cannula Placement Confirmation: positive ETCO2

## 2020-02-21 NOTE — Anesthesia Postprocedure Evaluation (Signed)
Anesthesia Post Note  Patient: Ryan Roth  Procedure(s) Performed: ESOPHAGOGASTRODUODENOSCOPY (EGD) WITH PROPOFOL (N/A Throat) BALLOON DILATION (N/A Throat)     Patient location during evaluation: PACU Anesthesia Type: General Level of consciousness: awake and alert Pain management: pain level controlled Vital Signs Assessment: post-procedure vital signs reviewed and stable Respiratory status: spontaneous breathing, nonlabored ventilation, respiratory function stable and patient connected to nasal cannula oxygen Cardiovascular status: blood pressure returned to baseline and stable Postop Assessment: no apparent nausea or vomiting Anesthetic complications: no    Adele Barthel Blossom Crume

## 2020-02-22 ENCOUNTER — Encounter: Payer: Self-pay | Admitting: *Deleted

## 2020-02-29 ENCOUNTER — Emergency Department
Admission: EM | Admit: 2020-02-29 | Discharge: 2020-02-29 | Disposition: A | Discharge status: Home / Self Care | Payer: Medicare Other | Attending: Emergency Medicine | Admitting: Emergency Medicine

## 2020-02-29 ENCOUNTER — Emergency Department: Payer: Medicare Other

## 2020-02-29 ENCOUNTER — Encounter: Payer: Self-pay | Admitting: Emergency Medicine

## 2020-02-29 ENCOUNTER — Other Ambulatory Visit: Payer: Self-pay

## 2020-02-29 DIAGNOSIS — S0990XA Unspecified injury of head, initial encounter: Secondary | ICD-10-CM

## 2020-02-29 DIAGNOSIS — Z87891 Personal history of nicotine dependence: Secondary | ICD-10-CM | POA: Insufficient documentation

## 2020-02-29 DIAGNOSIS — I10 Essential (primary) hypertension: Secondary | ICD-10-CM | POA: Diagnosis not present

## 2020-02-29 DIAGNOSIS — I252 Old myocardial infarction: Secondary | ICD-10-CM | POA: Diagnosis not present

## 2020-02-29 DIAGNOSIS — Y92511 Restaurant or cafe as the place of occurrence of the external cause: Secondary | ICD-10-CM | POA: Insufficient documentation

## 2020-02-29 DIAGNOSIS — Z79899 Other long term (current) drug therapy: Secondary | ICD-10-CM | POA: Diagnosis not present

## 2020-02-29 DIAGNOSIS — S0181XA Laceration without foreign body of other part of head, initial encounter: Secondary | ICD-10-CM | POA: Insufficient documentation

## 2020-02-29 DIAGNOSIS — Z955 Presence of coronary angioplasty implant and graft: Secondary | ICD-10-CM | POA: Insufficient documentation

## 2020-02-29 DIAGNOSIS — Y9389 Activity, other specified: Secondary | ICD-10-CM | POA: Insufficient documentation

## 2020-02-29 DIAGNOSIS — W01198A Fall on same level from slipping, tripping and stumbling with subsequent striking against other object, initial encounter: Secondary | ICD-10-CM | POA: Diagnosis not present

## 2020-02-29 DIAGNOSIS — Y998 Other external cause status: Secondary | ICD-10-CM | POA: Insufficient documentation

## 2020-02-29 DIAGNOSIS — Z7982 Long term (current) use of aspirin: Secondary | ICD-10-CM | POA: Diagnosis not present

## 2020-02-29 MED ORDER — LIDOCAINE HCL (PF) 1 % IJ SOLN
5.0000 mL | Freq: Once | INTRAMUSCULAR | Status: DC
  Filled 2020-02-29: qty 5

## 2020-02-29 MED ORDER — TETANUS-DIPHTH-ACELL PERTUSSIS 5-2.5-18.5 LF-MCG/0.5 IM SUSP
0.5000 mL | Freq: Once | INTRAMUSCULAR | Status: DC
  Filled 2020-02-29: qty 0.5

## 2020-02-29 MED ORDER — BACITRACIN-NEOMYCIN-POLYMYXIN OINTMENT TUBE
TOPICAL_OINTMENT | Freq: Once | CUTANEOUS | Status: AC
  Administered 2020-02-29: 1 via TOPICAL
  Filled 2020-02-29: qty 14.17

## 2020-02-29 NOTE — Discharge Instructions (Addendum)
Remove sutures in 5 - 7  days Clean the area with soap and water only. Keep the area covered when in public. Return if any sign of infection

## 2020-02-29 NOTE — ED Triage Notes (Signed)
Pt tripped and fell while at [employer] hitting left eyebrow area, lac noted. Unsure when last tetanus was, gauze applied on arrival.  Denies any LOC, takes aspirin

## 2020-02-29 NOTE — ED Notes (Addendum)
Tetanus declined due to pt just receiving covid shot. Wound on the right knee cleaned with NS and chlorahexadine. Neosporin dressing applied.

## 2020-02-29 NOTE — ED Provider Notes (Signed)
[facility] Emergency Department Provider Note  ____________________________________________   First MD Initiated Contact with Patient 02/29/20 2059     (approximate)  I have reviewed the triage vital signs and the nursing notes.   HISTORY  Chief Complaint Fall    HPI Ryan Roth is a 64 y.o. male presents emergency department after falling outside of the belly muscles from earlier tonight.  Patient states he did not lose consciousness.  Has laceration above the left eyebrow.  Skinned his knee.  No other injuries were reported.  Patient received his Covid vaccine 13 days ago.  Tdap is up-to-date.    Past Medical History:  Diagnosis Date  . Arthritis   . Complication of anesthesia    BP "bottomed out" during septoplasty  . Dental bridge present    lower right  . 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 Hypertension   . Hyperthyroidism 2000   Radioactive Iodine Treatments  . Hypothyroidism   . Lumbar stenosis   . Myocardial infarction ([place])    2012, 2014  . Thyroid disease   . Wears hearing aid in both ears    Has, does not wear    Patient Active Problem List   Diagnosis Date Noted  . Problems with swallowing and mastication   . Stricture and stenosis of esophagus   . Hypothyroid 01/13/2020  . Sensorineural hearing loss (SNHL), bilateral 01/13/2020  . History of splenectomy 05/04/2019  . Acute recurrent maxillary sinusitis 01/26/2019  . Osteoarthritis of left hip 09/14/2018  . Cubital tunnel syndrome, bilateral 07/10/2018  . Nasal obstruction 06/09/2018  . Carpal tunnel syndrome on both sides 03/04/2018  . S/P coronary artery stent placement 12/05/2017  . History of nonmelanoma skin cancer 04/14/2017  . Obesity with body mass index greater than 30 04/14/2017  . Atypical nevi 08/27/2016  . Tubular adenoma of colon 09/27/2015  . 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 Atherosclerotic heart disease of  native coronary artery without angina pectoris 09/01/2014  . Hypertrophy of prostate without urinary obstruction and other lower urinary tract symptoms (LUTS) 09/01/2014  . 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 Presence of left artificial knee joint 01/04/2014  . Chest pain 12/22/2012  . Localized osteoarthrosis, lower leg 11/02/2012  . Osteoarthritis of left knee 11/02/2012  . Essential hypertension 02/28/2012  . Hyperlipidemia 02/28/2012    Past Surgical History:  Procedure Laterality Date  . APPENDECTOMY    . BALLOON DILATION N/A 02/21/2020   Procedure: BALLOON DILATION;  Surgeon: Lucilla Lame, MD;  Location: [facility];  Service: Endoscopy;  Laterality: N/A;  . CORONARY ANGIOPLASTY WITH STENT PLACEMENT  2012  . ESOPHAGOGASTRODUODENOSCOPY (EGD) WITH PROPOFOL N/A 02/21/2020   Procedure: ESOPHAGOGASTRODUODENOSCOPY (EGD) WITH PROPOFOL;  Surgeon: Lucilla Lame, MD;  Location: [facility];  Service: Endoscopy;  Laterality: N/A;  . SEPTOPLASTY  09/04/2018   [facility]  . SPLENECTOMY  1990    Prior to Admission medications   Medication Sig Start Date End Date Taking? Authorizing Provider  aspirin 81 MG chewable tablet Chew by mouth.    [provider]  cycloSPORINE (RESTASIS) 0.05 % ophthalmic emulsion Place one drop into both eyes 2 (two) times daily. 11/16/19   [provider]  doxazosin (CARDURA) 8 MG tablet Take 1 tablet by mouth daily. 12/19/15   [provider]  fluticasone (FLONASE) 50 MCG/ACT nasal spray 2 sprays into each nostril once daily or 1 spray into each nostril  twice daily. 06/09/18   [provider]  hydrochlorothiazide (HYDRODIURIL) 25 MG tablet Take by mouth. 11/03/18   [provider]  Ipratropium-Albuterol (COMBIVENT) 20-100 MCG/ACT AERS respimat Inhale into the lungs. 11/16/19   [provider]  levothyroxine (SYNTHROID) 150 MCG tablet Take 150 mcg by mouth daily before breakfast.    [provider]  lisinopril (PRINIVIL,ZESTRIL) 40 MG tablet Take 1 tablet by mouth daily. 12/08/15   [provider]  meloxicam (MOBIC) 15 MG tablet Take 15 mg by mouth daily. 10/07/19   [provider]  methocarbamol (ROBAXIN) 750 MG tablet Take by mouth.    [provider]  metoprolol succinate (TOPROL-XL) 50 MG 24 hr tablet Take 1 tablet by mouth daily. 02/21/16   [provider]  nitroGLYCERIN (NITROSTAT) 0.4 MG SL tablet Place under the tongue. 02/26/16   [provider]  rosuvastatin (CRESTOR) 20 MG tablet Take 20 mg by mouth at bedtime. 12/06/19   [provider]  spironolactone (ALDACTONE) 25 MG tablet Take 1 tablet by mouth daily. 12/19/15   [provider]    Allergies Cefazolin  Family History  Problem Relation Age of Onset  . Stroke Mother   . Cancer Father     Social History Social History   Tobacco Use  . Smoking status: Former Smoker    Packs/day: 0.50    Quit date: 12/09/2017    Years since quitting: 2.2  . Smokeless tobacco: Never Used  Substance Use Topics  . Alcohol use: No    Comment: rarely drinks - may have couple drinks/month  . Drug use: Never    Review of Systems  Constitutional: No fever/chills Eyes: No visual changes. ENT: No sore throat. Respiratory: Denies cough Cardiovascular: Denies chest pain Gastrointestinal: Denies abdominal pain Genitourinary: Negative for dysuria. Musculoskeletal: Negative for back pain. Skin: Positive laceration to the left brow Psychiatric: no mood changes,     ____________________________________________   PHYSICAL EXAM:  VITAL SIGNS: ED Triage Vitals  Enc Vitals Group     BP 02/29/20 1951 134/79     Pulse Rate 02/29/20 1951 67     Resp 02/29/20 1951 18     Temp 02/29/20 1951 97.9 F (36.6 C)     Temp Source 02/29/20 1951 Oral     SpO2 02/29/20 1951 100 %     Weight 02/29/20 1946 267 lb (121.1 kg)     Height 02/29/20 1946 6\' 7"  (2.007 m)      Head Circumference --      Peak Flow --      Pain Score --      Pain Loc --      Pain Edu? --      Excl. in [place]? --     Constitutional: Alert and oriented. Well appearing and in no acute distress. Eyes: Conjunctivae are normal.  Head: Laceration to the left brow Nose: No congestion/rhinnorhea. Mouth/Throat: Mucous membranes are moist.   Neck:  supple no lymphadenopathy noted Cardiovascular: Normal rate, regular rhythm. Respiratory: Normal respiratory effort.  No retractions, GU: deferred Musculoskeletal: FROM all extremities, warm and well perfused Neurologic:  Normal speech and language.  Skin:  Skin is warm, dry , positive laceration to the left brow,. No rash noted. Psychiatric: Mood and affect are normal. Speech and behavior are normal.  ____________________________________________   LABS (all labs ordered are listed, but only abnormal results are displayed)  Labs Reviewed - No data to display ____________________________________________   ____________________________________________  RADIOLOGY  CT  the head and C-spine are both negative  ____________________________________________   PROCEDURES  Procedure(s) performed:   Marland KitchenMarland KitchenLaceration Repair  Date/Time: 02/29/2020 11:22 PM Performed by: Versie Starks, PA-C Authorized by: Versie Starks, PA-C   Consent:    Consent obtained:  Verbal   Consent given by:  Patient   Risks discussed:  Infection, pain, retained foreign body, poor cosmetic result and poor wound healing Anesthesia (see MAR for exact dosages):    Anesthesia method:  Local infiltration   Local anesthetic:  Lidocaine 1% w/o epi Laceration details:    Location:  Face   Face location:  L eyebrow   Length (cm):  2 Repair type:    Repair type:  Simple Pre-procedure details:    Preparation:  Patient was prepped and draped in usual sterile fashion Exploration:    Hemostasis achieved with:  Direct pressure   Wound exploration: wound explored  through full range of motion     Wound extent: no foreign bodies/material noted and no underlying fracture noted     Contaminated: no   Treatment:    Area cleansed with:  Betadine and saline   Amount of cleaning:  Standard   Irrigation solution:  Sterile saline   Irrigation method:  Syringe and tap Skin repair:    Repair method:  Sutures   Suture size:  4-0   Suture material:  Prolene   Suture technique:  Simple interrupted   Number of sutures:  4 Approximation:    Approximation:  Close Post-procedure details:    Dressing:  Antibiotic ointment and non-adherent dressing   Patient tolerance of procedure:  Tolerated well, no immediate complications      ____________________________________________   INITIAL IMPRESSION / ASSESSMENT AND PLAN / ED COURSE  Pertinent labs & imaging results that were available during my care of the patient were reviewed by me and considered in my medical decision making (see chart for details).   Patient 64 year old male presents emergency department after a fall sustaining a laceration to the left brow.  See HPI Physical exam patient appears well.  There is a 2 cm laceration to the left brow.  CT of the head and C-spine are both negative. Explained findings to the patient.  See procedure note for repair  Patient recently received Covid vaccine approximately 13 days ago.  His Tdap was in 2016 so feel it would be prudent for [place] to wait as then he is not supposed to have additional vaccinations for 14 days after the vaccine.  He can follow-up with his regular doctor if he feels he needs additional vaccination.  He states he understands will comply.  Suture should be removed in 5 to 7 days.  He was discharged stable condition.    Stanislaw Moriarity was evaluated in Emergency Department on 02/29/2020 for the symptoms described in the history of present illness. He was evaluated in the context of the global COVID-19 pandemic, which necessitated consideration that  the patient might be at risk for infection with the SARS-CoV-2 virus that causes COVID-19. Institutional protocols and algorithms that pertain to the evaluation of patients at risk for COVID-19 are in a state of rapid change based on information released by regulatory bodies including the CDC and federal and state organizations. These policies and algorithms were followed during the patient's care in the ED.   As part of my medical decision making, I reviewed the following data within the [place] History obtained from family, Nursing notes reviewed and incorporated,  Old chart reviewed, Radiograph reviewed , Notes from prior ED visits and [place] Controlled Substance Database  ____________________________________________   FINAL CLINICAL IMPRESSION(S) / ED DIAGNOSES  Final diagnoses:  Facial laceration, initial encounter  Minor head injury, initial encounter      NEW MEDICATIONS STARTED DURING THIS VISIT:  Discharge Medication List as of 02/29/2020  9:50 PM       Note:  This document was prepared using Dragon voice recognition software and may include unintentional dictation errors.    Versie Starks, PA-C 02/29/20 2325    Vanessa La Carla, MD 03/02/20 [id number]

## 2020-09-27 IMAGING — CT CT HEAD W/O CM
3 series · 15 of 47 positions shown, 18 images · non-contrast
Comparison: None.

CLINICAL DATA: 63-year-old male with trauma.

EXAM:
CT HEAD WITHOUT CONTRAST
CT CERVICAL SPINE WITHOUT CONTRAST
TECHNIQUE: Multidetector CT imaging of the head and cervical spine was
performed following the standard protocol without intravenous
contrast. Multiplanar CT image reconstructions of the cervical spine
were also generated.

[Series 2: head wo · axial · 0.44mm/px · z∈[-140,-5]mm · 9 of 33 slices shown, 12 images]
[im 3/33  brain]
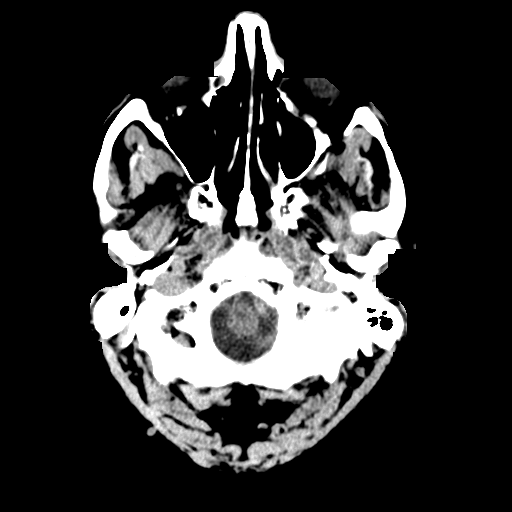
[im 3/33  bone]
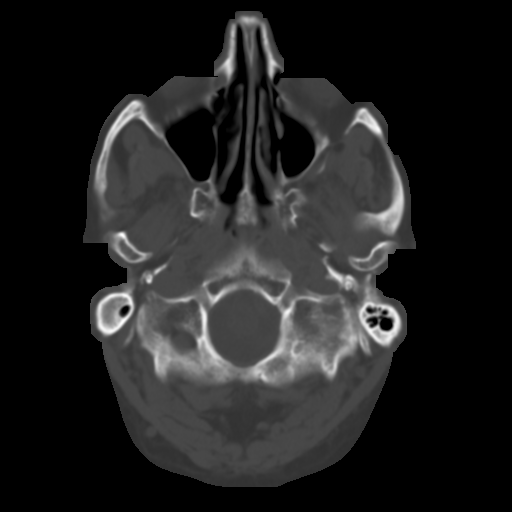
[im 6/33  brain]
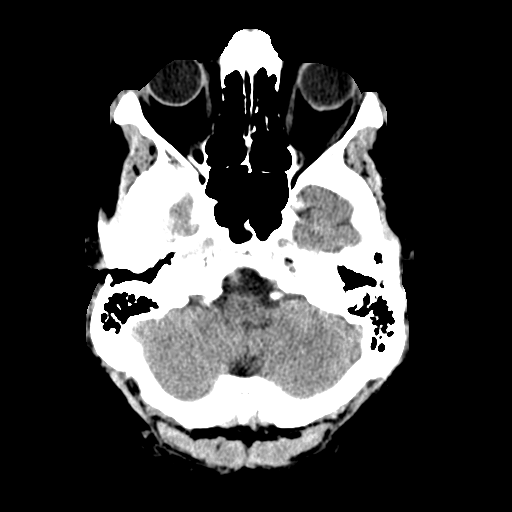
[im 9/33  brain]
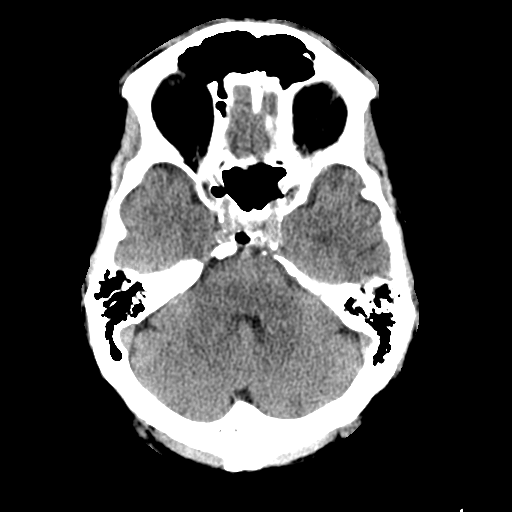
[im 13/33  brain]
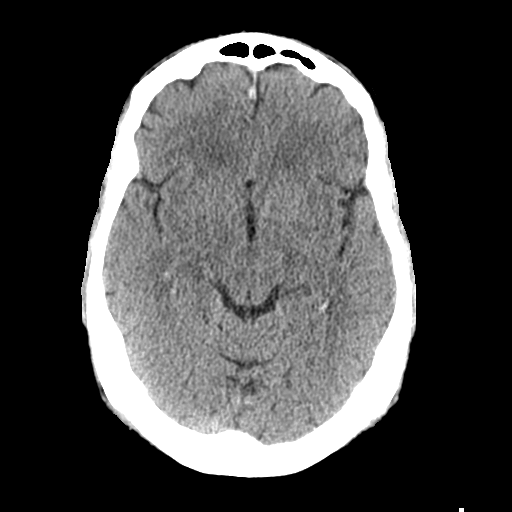
[im 17/33  brain]
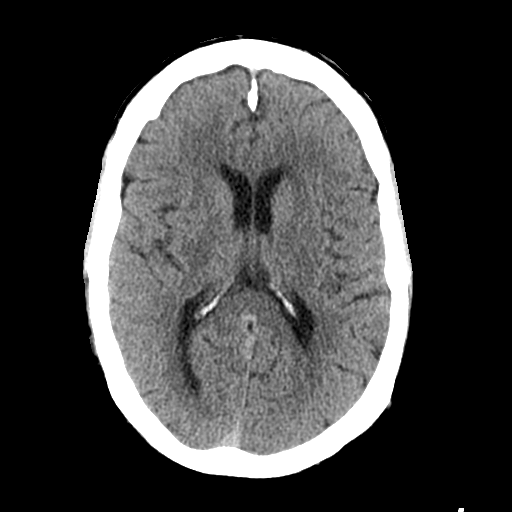
[im 17/33  bone]
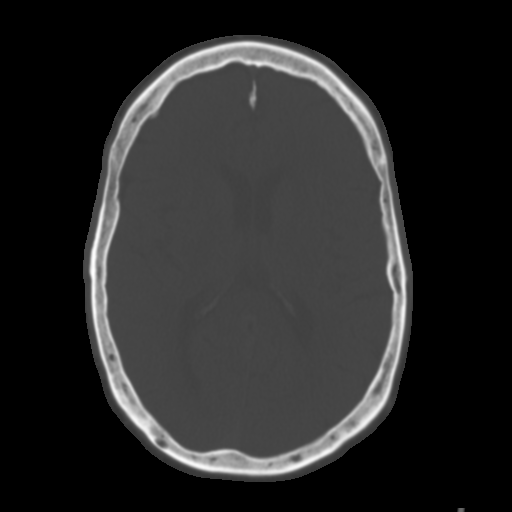
[im 20/33  brain]
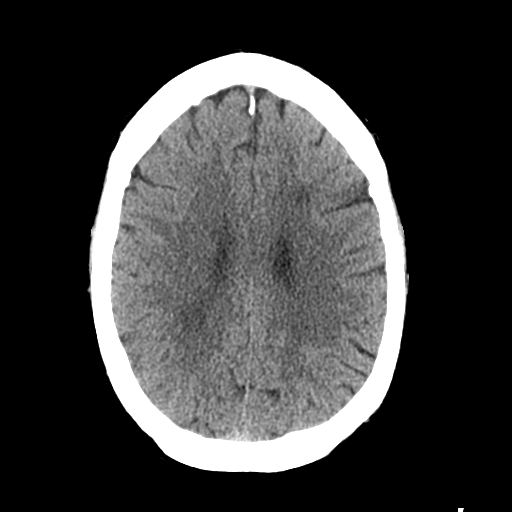
[im 24/33  brain]
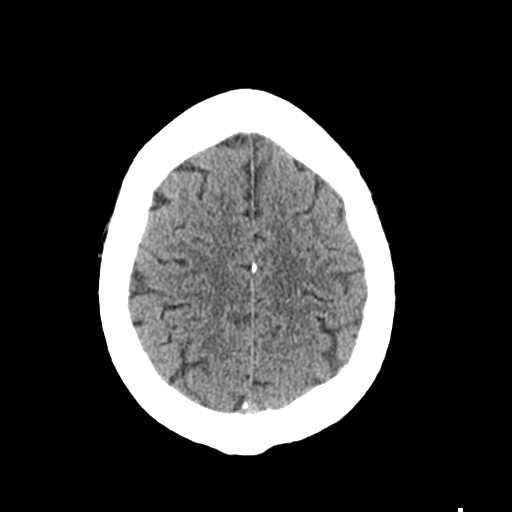
[im 27/33  brain]
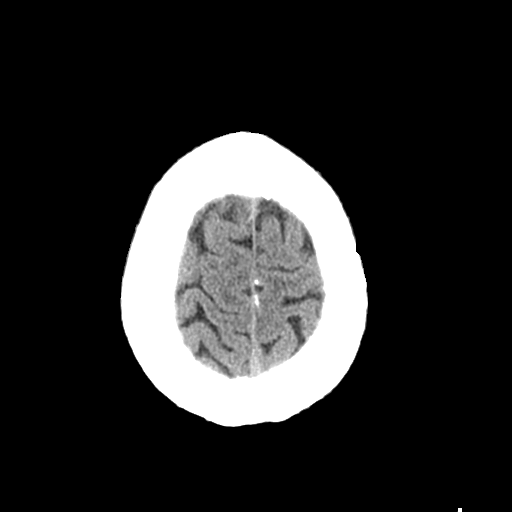
[im 30/33  brain]
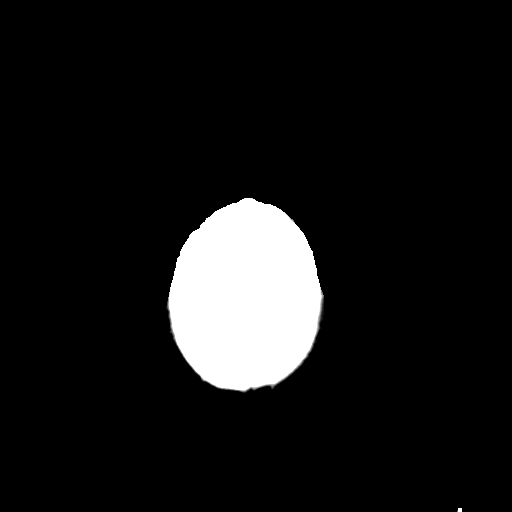
[im 30/33  bone]
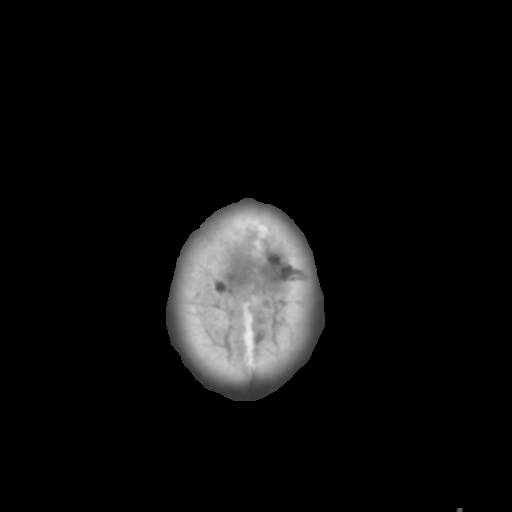

[Series 4: coronal soft tissue · coronal · 0.32mm/px · 3 of 69 slices shown]
[im 23/69  brain]
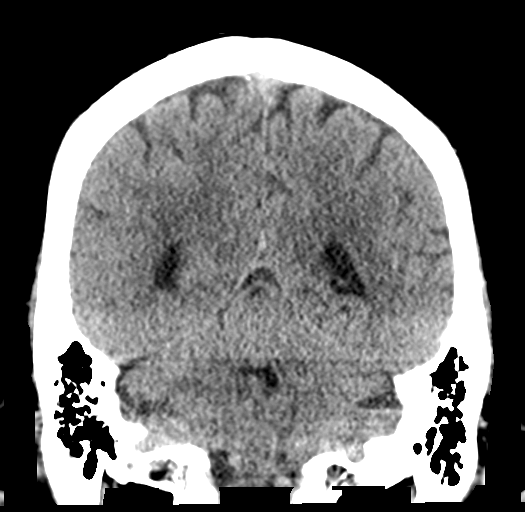
[im 31/69  brain]
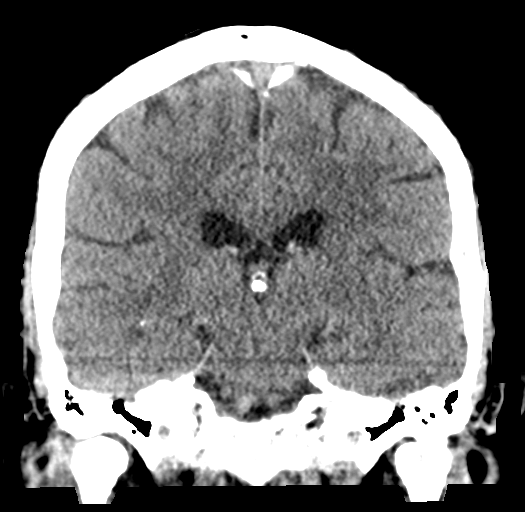
[im 38/69  brain]
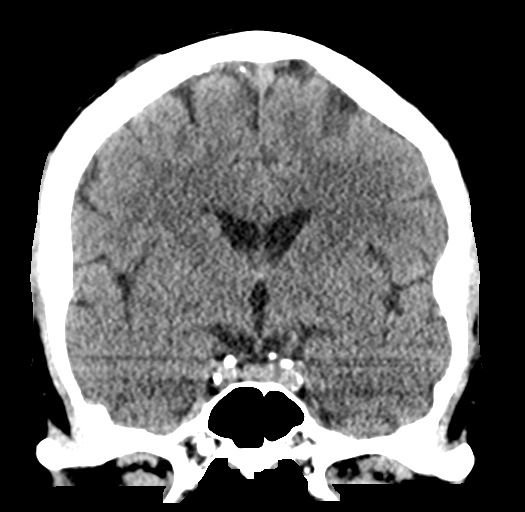

[Series 5: sagittal soft tissue · sagittal · 0.33mm/px · 3 of 50 slices shown]
[im 17/50  brain]
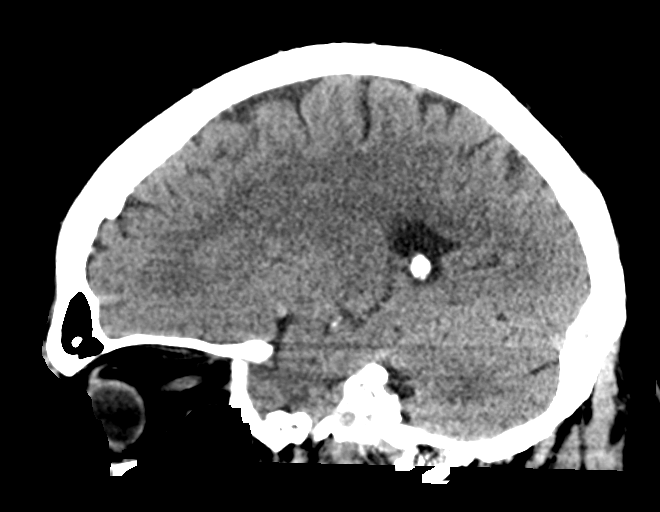
[im 25/50  brain]
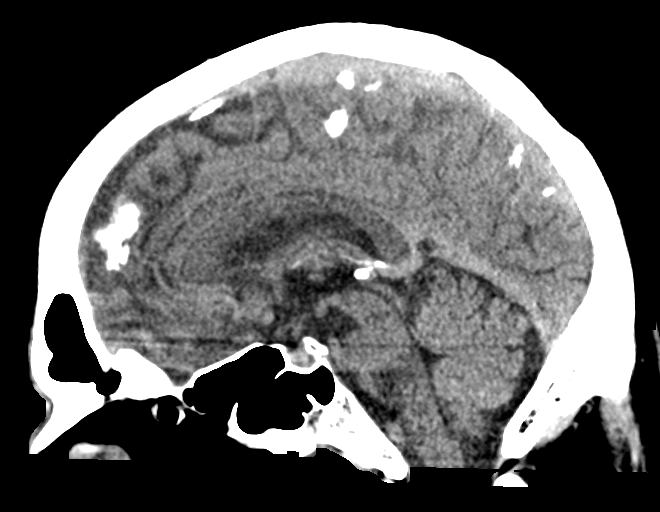
[im 33/50  brain]
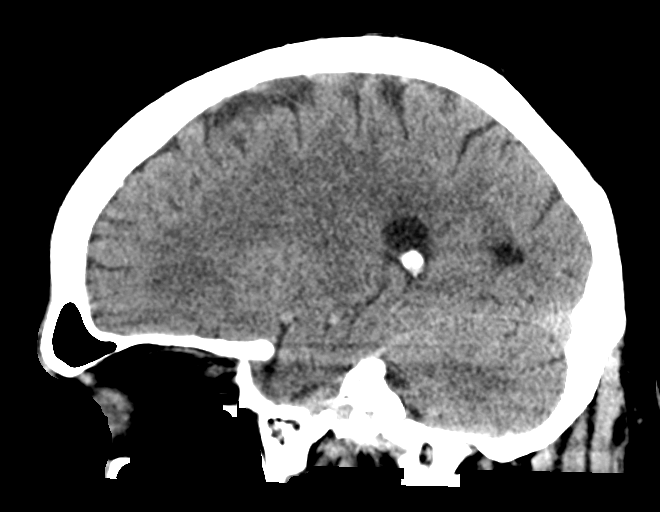

[15 of 47 positions shown; findings below may reference images not displayed]

FINDINGS: CT HEAD FINDINGS

Brain: The ventricles and sulci appropriate size for patient's age.
Mild periventricular and deep white matter chronic microvascular
ischemic changes noted. There is no acute intracranial hemorrhage.
No mass effect or midline shift. No extra-axial fluid collection.

Vascular: No hyperdense vessel or unexpected calcification.

Skull: Normal. Negative for fracture or focal lesion.

Sinuses/Orbits: No acute finding.

Other: None

CT CERVICAL SPINE FINDINGS

Alignment: No acute subluxation. There is straightening of normal
cervical lordosis which may be positional or due to muscle spasm

Skull base and vertebrae: No acute fracture. Osteopenia.

Soft tissues and spinal canal: No prevertebral fluid or swelling. No
visible canal hematoma.

Disc levels: Multilevel degenerative changes with endplate
irregularity and disc space narrowing and spurring and osteophyte.

Upper chest: Negative.

Other: Bilateral carotid bulb calcified plaques, right greater than
left. Mildly enlarged left cervical lymph nodes measure 15 mm in
short axis (series 3, image 44). Clinical correlation is
recommended.
IMPRESSION: 1. No acute intracranial pathology.
2. No acute/traumatic cervical spine pathology.
3. Mildly enlarged left cervical lymph nodes. Clinical correlation
is recommended.

## 2023-03-19 ENCOUNTER — Ambulatory Visit: Payer: Medicare Other | Admitting: Cardiovascular Disease

## 2023-05-26 ENCOUNTER — Ambulatory Visit: Payer: Medicare Other | Admitting: Cardiovascular Disease

## 2023-06-09 ENCOUNTER — Ambulatory Visit: Payer: Medicare Other | Admitting: Cardiovascular Disease

## 2023-06-11 ENCOUNTER — Encounter: Payer: Self-pay | Admitting: Internal Medicine

## 2023-06-11 ENCOUNTER — Ambulatory Visit: Payer: Medicare Other | Attending: Cardiovascular Disease | Admitting: Internal Medicine

## 2023-06-11 VITALS — BP 125/76 | HR 67 | Ht 79.0 in | Wt 247.2 lb

## 2023-06-11 DIAGNOSIS — R42 Dizziness and giddiness: Secondary | ICD-10-CM | POA: Diagnosis not present

## 2023-06-11 DIAGNOSIS — I2510 Atherosclerotic heart disease of native coronary artery without angina pectoris: Secondary | ICD-10-CM

## 2023-06-11 DIAGNOSIS — R5382 Chronic fatigue, unspecified: Secondary | ICD-10-CM

## 2023-06-11 DIAGNOSIS — E785 Hyperlipidemia, unspecified: Secondary | ICD-10-CM | POA: Diagnosis not present

## 2023-06-11 DIAGNOSIS — Z8572 Personal history of non-Hodgkin lymphomas: Secondary | ICD-10-CM | POA: Insufficient documentation

## 2023-06-11 DIAGNOSIS — R634 Abnormal weight loss: Secondary | ICD-10-CM

## 2023-06-11 MED ORDER — DOXAZOSIN MESYLATE 4 MG PO TABS: 4.0000 mg | ORAL_TABLET | Freq: Every day | ORAL | 3 refills | Status: DC

## 2023-06-11 NOTE — Patient Instructions (Addendum)
Please monitor your BP daily and stay hydrated Remember to wear your CPAP  Medication Instructions:   Decrease Doxazosin to 4 mg daily at night   *If you need a refill on your cardiac medications before your next appointment, please call your pharmacy*   Lab Work: None  If you have labs (blood work) drawn today and your tests are completely normal, you will receive your results only by: MyChart Message (if you have MyChart) OR A paper copy in the mail If you have any lab test that is abnormal or we need to change your treatment, we will call you to review the results.   Testing/Procedures: none   Follow-Up: At [facility], you and your health needs are our priority.  As part of our continuing mission to provide you with exceptional heart care, we have created designated Provider Care Teams.  These Care Teams include your primary Cardiologist (physician) and Advanced Practice Providers (APPs -  Physician Assistants and Nurse Practitioners) who all work together to provide you with the care you need, when you need it.  We recommend signing up for the patient portal called "MyChart".  Sign up information is provided on this After Visit Summary.  MyChart is used to connect with patients for Virtual Visits (Telemedicine).  Patients are able to view lab/test results, encounter notes, upcoming appointments, etc.  Non-urgent messages can be sent to your provider as well.   To learn more about what you can do with MyChart, go to [email or web address].    Your next appointment:   1 month(s)  Provider:  You may see Yvonne Kendall, MD or one of the following Advanced Practice Providers on your designated Care Team:   Nicolasa Ducking, NP Eula Listen, PA-C Cadence Fransico Michael, PA-C Charlsie Quest, NP

## 2023-06-11 NOTE — Progress Notes (Signed)
Cardiology Office Note:  .   Date:  06/11/2023  ID:  Ryan Roth, DOB 1956/05/14, MRN [id number] PCP: [name], MD  [facility] Providers Cardiologist:  New  History of Present Illness: .   Ryan Roth is a 67 y.o. male with history of coronary artery disease status post PCI in 2012 at [facility], hypertension, hyperlipidemia, diffuse large B-cell lymphoma, ITP, and BPH, who presents as a self-referral to establish cardiology care in the area.  He was last followed by Dr. Vear Clock at [facility] in [place], having last been seen in October, 2023.  He has previously seen cardiologists at [facility] and [facility].  He complained of shortness of breath, dizziness, and hypoxia at that time, with preceding echo showing dilated RV without evidence of intracardiac shunting.  He was referred to pulmonology and also went right heart cath, which showed normal left heart filling pressures, mildly elevated right heart filling pressures, and borderline low cardiac output/index.  Today, Mr. Music presents with multiple complaints.  He reports a history of chronic fatigue, feeling like his muscles are tired, dating back to shortly before his lymphoma diagnosis in 2021.  He also has shortness of breath that is most pronounced when he is moving his upper extremities.  Walking alone does not seem to bring this on.  He also notes intermittent lightheadedness, especially when standing, which has prompted his oncologist to invoke POTS as a possible diagnosis for the aforementioned symptoms.  He denies frank chest pain but notes intermittent heartburn and dysphagia.  He has also had other GI issues.  He gets up frequently at night to urinate and often feels lighthead when doing this.  He has not passed out or fallen.  He reports having chest pain leading up to his PCI in the setting of what sounds like an an acute MI in 2012.  He reports having undergone subsequent caths at [facility] and [facility] (most recently in 2021) that did not show any obstructive coronary disease.  Mr. Ryan Roth reports a 30 pound unintentional weight loss over the last 1.5 years.  He reports having been diagnosed with moderate sleep apnea in the past by a pulmonologist but questions this diagnosis and does not believe that he needs to use CPAP based on his review of the literature.  He notes that occasionally he will check his oxygen saturations at rest at home and notices that they drop into the 80s.  ROS: See HPI  Studies Reviewed: Marland Kitchen   EKG Interpretation Date/Time:  Wednesday June 11 2023 15:17:15 EDT Ventricular Rate:  67 PR Interval:  194 QRS Duration:  100 QT Interval:  370 QTC Calculation: 390 R Axis:   -52  Text Interpretation: Normal sinus rhythm Left axis deviation Septal infarct , age undetermined Inferior infarct , age undetermined When compared with ECG of 25-Feb-2016 21:09, Criteria for Septal infarct are now Present Confirmed by Clary Meeker, Cristal Deer [phone]) on 06/11/2023 3:20:45 PM    RHC (09/20/2022, [facility]): Mean RA 10, RV 25/9, PA 27/14 (19), PCWP 10.  AO saturation 94%, PA saturation 67%.  Fick CO/CI 5.6,2.2.  Echo bubble study (07/22/2022, [facility]): Negative bubble study.  Pharmacologic MPI (07/11/2022, [facility]): Low risk study without evidence of ischemia or scar.  LVEF 63%.  TTE (06/21/2022, [facility]): Mildly dilated left ventricle with mild LVH.  LVEF greater than 55% with normal wall motion.  Grade 1 diastolic dysfunction.  Mildly dilated RV with mildly reduced contraction.  No significant valvular abnormalities.  LHC (11/16/2020, [facility]): Diffuse mild to moderate coronary artery disease.  Proximal and mid RCA were not hemodynamically significant (RFR 0.95).  LHC (12/23/2012, [facility]): Widely patent proximal LAD stent.  25% mid and distal LAD disease as well as involving OM1 and mid RCA.  LVEF 55%.  LHC/PCI (04/01/2011, [facility]): LAD 70% proximal and 50% distal lesions, OM1 30%  stenosis, and mid/distal RCA with 50% stenoses.  LVEF 55%.  PCI to proximal LAD using Xience 3.5 x 8 mm DES.  Risk Assessment/Calculations:             Physical Exam:   VS:  BP 125/76 (BP Location: Right Arm, Patient Position: Sitting, Cuff Size: Large)   Pulse 67   Ht 6\' 7"  (2.007 m)   Wt 247 lb 4 oz (112.2 kg)   SpO2 97%   BMI 27.85 kg/m    Wt Readings from Last 3 Encounters:  06/11/23 247 lb 4 oz (112.2 kg)  02/29/20 267 lb (121.1 kg)  02/21/20 267 lb (121.1 kg)   Position Blood pressure (mmHg) Heart rate (bpm)  Lying 135/84 66  Sitting 125/77 75  Standing 96/66 99  Standing (3 minutes) 92/63 91   General:  NAD. Neck: No JVD or HJR. Lungs: Clear to auscultation bilaterally without wheezes or crackles. Heart: Regular rate and rhythm without murmurs, rubs, or gallops. Abdomen: Soft, nontender, nondistended. Extremities: No lower extremity edema.  ASSESSMENT AND PLAN: .    Coronary artery disease and fatigue: Mr. Bolotin does not report any frank angina but complains of chronic fatigue and unusual shortness of breath that occurs mostly when he is doing activities with his arms.  He has undergone extensive evaluation over the last 1 to 2 years including MPI without evidence of ischemia on 07/2022, echocardiogram in 06/2022 showing normal LVEF with grade 1 diastolic dysfunction and mild biventricular enlargement.,  Bubble study that was negative for intracardiac shunt, and right heart catheterization (09/2022), that demonstrated borderline elevated right heart filling pressures, upper normal pulmonary artery pressure, and normal PCWP.  Fick cardiac output/index was borderline low.  I suspect that untreated sleep apnea is playing a role in Mr. Guo's symptoms, though he is reluctant to accept this diagnosis and consider CPAP.  I have encouraged him to reach out to his sleep provider to discuss this further.  Given a component of orthostatic lightheadedness and orthostatic hypotension  on exam today, we will decrease doxazosin to 4 mg nightly today.  If his symptoms do not improve, we will need to consider cardiopulmonary stress test to better understand potential etiology of his chronic fatigue.  Orthostatic hypotension: Significant blood pressure drop with associated heart rate jumped noted today, likely contributing to Mr. Nogueira's intermittent lightheadedness.  I do not think this explains his chronic fatigue as well as shortness of breath with movement of his arms.  We discussed potential etiologies for this including dehydration, medication side effects, and autonomic dysfunction.  Before making a diagnosis of dysautonomia, I think it would be prudent to try to adjust his medications.  We have therefore agreed to decrease doxazosin to 4 mg nightly.  Ultimately, it may be worthwhile to transition this to another alpha-blocker that is less prone to dropping his blood pressure.  We will to monitor his supine blood pressures, however, as he also has underlying hypertension.  I have encouraged Mr. Navarez to stay well-hydrated and to consider using tight fitting garments including compression stockings to help with venous return.  If his orthostatic lightheadedness does  not improve with these interventions, I will refer him to Dr. Graciela Husbands for further evaluation.  Hyperlipidemia: Continue rosuvastatin 20 mg daily.  Will need to consider repeating lipid panel at next visit if not done recently through his PCPs office.  Unintentional weight loss and history of B-cell lymphoma: Weight loss of 30 pounds over the last 1.5 years is of uncertain etiology, though Mr. Woodring has had several GI issues.  He continues to undergo close surveillance of his prior B-cell lymphoma through his oncology team at [facility].  Follow-up: Return to clinic in 4 to 6 weeks.  Signed, Yvonne Kendall, MD

## 2023-07-18 ENCOUNTER — Encounter: Payer: Self-pay | Admitting: Cardiology

## 2023-07-18 ENCOUNTER — Ambulatory Visit (INDEPENDENT_AMBULATORY_CARE_PROVIDER_SITE_OTHER): Payer: Medicare Other

## 2023-07-18 ENCOUNTER — Ambulatory Visit: Payer: Medicare Other | Admitting: Cardiology

## 2023-07-18 VITALS — BP 138/88 | HR 43 | Ht 79.0 in | Wt 244.6 lb

## 2023-07-18 DIAGNOSIS — R0602 Shortness of breath: Secondary | ICD-10-CM | POA: Diagnosis not present

## 2023-07-18 DIAGNOSIS — R5382 Chronic fatigue, unspecified: Secondary | ICD-10-CM

## 2023-07-18 DIAGNOSIS — G901 Familial dysautonomia [Riley-Day]: Secondary | ICD-10-CM | POA: Diagnosis not present

## 2023-07-18 DIAGNOSIS — R634 Abnormal weight loss: Secondary | ICD-10-CM

## 2023-07-18 DIAGNOSIS — E785 Hyperlipidemia, unspecified: Secondary | ICD-10-CM

## 2023-07-18 DIAGNOSIS — R42 Dizziness and giddiness: Secondary | ICD-10-CM

## 2023-07-18 DIAGNOSIS — R002 Palpitations: Secondary | ICD-10-CM

## 2023-07-18 DIAGNOSIS — Z8572 Personal history of non-Hodgkin lymphomas: Secondary | ICD-10-CM

## 2023-07-18 DIAGNOSIS — I2510 Atherosclerotic heart disease of native coronary artery without angina pectoris: Secondary | ICD-10-CM

## 2023-07-18 NOTE — Progress Notes (Signed)
Cardiology Office Note:  .   Date:  07/18/2023  ID:  Ryan Roth, DOB [birth year], MRN [id number] PCP: [name], MD  [facility] Providers Cardiologist:  Yvonne Kendall, MD    History of Present Illness: .   Ryan Roth is a 67 y.o. male with a past medical history of coronary artery disease status post PCI in 2012 at [facility], hypertension, hyperlipidemia, diffuse large B-cell lymphoma, ITP, and BPH, who presents today for follow-up.  He had previously been followed by [facility] cardiology and was last seen in the mid week last seen him in October 2023.  He does have follow-up with [facility].  He had complained of shortness of breath, dizziness, and hypoxia at that time with preceding echo revealing dilated RV without evidence of intracardiac shunting.  He was referred to pulmonary and also underwent a right heart catheterization which showed normal left heart filling pressures, mildly elevated right heart filling pressures, and borderline low cardiac output/index.  He had his initial MI in 2012 and reports he underwent subsequent heart catheterizations at [facility] with the most recent 1 in 2021 that did not show any obstructive coronary disease.  He was last seen in clinic on 06/28/2023 by Dr. Okey Dupre.  At that time he continued to report a history of chronic fatigue, tired muscles, dating back shortly reports lymphoma diagnosis from 2021.  He complained of shortness of breath that was most pronounced when he was moving his upper extremities.  Walking alone did not seem to bring this on.  He also had intermittent lightheadedness, especially when standing, which is probably had his oncologist to invoke POTS as a possible diagnosis.  With his orthostatic hypotension his doxazosin was decreased to 4 mg nightly.  If he continued of symptoms further he would be referred to EP Dr. Graciela Husbands for evaluation and POTS work.  He was also discussed that if his symptoms  did not improve that he would need to consider stress testing to better understand potential etiology of his chronic fatigue.  He returns to clinic today with continued complaints of fatigue, SOB, and tired muscles. His oxygen saturations at home have been dropping into the 80's. Heart rate varies from 56-133 bpm per the app on his phone. He continues to have lightheadedness and dizziness. He states that he has been drinking over 80 ounces of water a day. Has not increased his sodium intake due to after his previous MI he was on a low sodium diet. Was also recently treated for a UTI. Denies any chest pain or chest tightness. Denies any hospitalizations or visits to the emergency department.   ROS: 10 point review of system has been reviewed and considered negative with exception of what is been listed in the HPI  Studies Reviewed: Marland Kitchen       RHC (09/20/2022, [facility]): Mean RA 10, RV 25/9, PA 27/14 (19), PCWP 10.  AO saturation 94%, PA saturation 67%.  Fick CO/CI 5.6,2.2.   Echo bubble study (07/22/2022, [facility]): Negative bubble study.   Pharmacologic MPI (07/11/2022, [facility]): Low risk study without evidence of ischemia or scar.  LVEF 63%.   TTE (06/21/2022, [facility]): Mildly dilated left ventricle with mild LVH.  LVEF greater than 55% with normal wall motion.  Grade 1 diastolic dysfunction.  Mildly dilated RV with mildly reduced contraction.  No significant valvular abnormalities.   LHC (11/16/2020, [facility]): Diffuse mild to moderate coronary artery disease.  Proximal and mid RCA were not hemodynamically  significant (RFR 0.95).   LHC (12/23/2012, [facility]): Widely patent proximal LAD stent.  25% mid and distal LAD disease as well as involving OM1 and mid RCA.  LVEF 55%.   LHC/PCI (04/01/2011, [facility]): LAD 70% proximal and 50% distal lesions, OM1 30% stenosis, and mid/distal RCA with 50% stenoses.  LVEF 55%.  PCI to proximal LAD using Xience 3.5 x 8 mm DES. Risk Assessment/Calculations:             Physical  Exam:   VS:  BP 138/88 (BP Location: Left Arm, Patient Position: Sitting, Cuff Size: Normal)   Pulse (!) 43   Ht 6\' 7"  (2.007 m)   Wt 244 lb 9.6 oz (110.9 kg)   SpO2 90%   BMI 27.56 kg/m    Wt Readings from Last 3 Encounters:  07/18/23 244 lb 9.6 oz (110.9 kg)  06/11/23 247 lb 4 oz (112.2 kg)  02/29/20 267 lb (121.1 kg)    GEN: Well nourished, well developed in no acute distress NECK: No JVD; No carotid bruits CARDIAC: RRR, no murmurs, rubs, gallops RESPIRATORY:  Clear to auscultation without rales, wheezing or rhonchi  ABDOMEN: Soft, non-tender, non-distended EXTREMITIES:  No edema; No deformity   ASSESSMENT AND PLAN: .   Coronary artery disease with shortness of breath and fatigue.  He complains of chronic fatigue and shortness of breath that occurs when he is doing any activity above his head or primarily with his arms or bending over.  He states that shortness of breath has been gradually worsening last echocardiogram done at [facility] in 06/2022 revealed an LVEF that was normal with G1 DD mild biventricular enlargement.  With his complaints of potential change in his shortness of breath we will reschedule him for an echo to ensure heart function has not changed.  Orthostatic hypotension where he continues to have a drop in blood pressure and associated heart rate increased which likely contributes to his lightheadedness.  He is continued to take his doxazosin nightly but states he has not had any changes in his symptoms.  Possible dysautonomia where he has been encouraged to continue to maintain adequate hydration, increase his sodium, remain as active as possible with either isometric exercises, yoga, walking or using recumbent bike.  He has also been encouraged to try compression sleeves, pants, or abdominal binder.  He is also being referred to Dr. Graciela Husbands for further evaluation.  Sinus bradycardia with heart rate today 43.  Heart rate is been varying from the 40s to the 130s.  With  occasional palpitations with noted changes in heart rate.  He has been placed on a ZIO XT monitor to rule out any arrhythmias or pauses.  Hyperlipidemia where he is continued on rosuvastatin 20 mg daily.  Will need a repeat lipid panel on return as he just followed up with his PCP and was treated for UTI.  Unintentional weight loss with a history of B-cell lymphoma.  Patient's wife is down an additional 3 pounds today.  He states he has been told by his oncologist that them his lymphoma has not returned.  He has also had several GI issues the workup from gastroenterology has been unrevealing.  He continues to be followed with close surveillance by oncology at his team at [facility].       Dispo: Patient to return to clinic to see MD/APP in 3 months or sooner if needed to reevaluate symptoms.   Signed, Doloris Servantes, NP

## 2023-07-18 NOTE — Patient Instructions (Signed)
Medication Instructions:  No changes at this time.   *If you need a refill on your cardiac medications before your next appointment, please call your pharmacy*   Lab Work: None  If you have labs (blood work) drawn today and your tests are completely normal, you will receive your results only by: MyChart Message (if you have MyChart) OR A paper copy in the mail If you have any lab test that is abnormal or we need to change your treatment, we will call you to review the results.   Testing/Procedures: Your physician has requested that you have an echocardiogram. Echocardiography is a painless test that uses sound waves to create images of your heart. It provides your doctor with information about the size and shape of your heart and how well your heart's chambers and valves are working. This procedure takes approximately one hour. There are no restrictions for this procedure. Please do NOT wear cologne, perfume, aftershave, or lotions (deodorant is allowed). Please arrive 15 minutes prior to your appointment time.  Your physician has recommended that you wear a Zio monitor.   This monitor is a medical device that records the heart's electrical activity. Doctors most often use these monitors to diagnose arrhythmias. Arrhythmias are problems with the speed or rhythm of the heartbeat. The monitor is a small device applied to your chest. You can wear one while you do your normal daily activities. While wearing this monitor if you have any symptoms to push the button and record what you felt. Once you have worn this monitor for the period of time provider prescribed (Usually 14 days), you will return the monitor device in the postage paid box. Once it is returned they will download the data collected and provide [place] with a report which the provider will then review and we will call you with those results. Important tips:  Avoid showering during the first 24 hours of wearing the monitor. Avoid  excessive sweating to help maximize wear time. Do not submerge the device, no hot tubs, and no swimming pools. Keep any lotions or oils away from the patch. After 24 hours you may shower with the patch on. Take brief showers with your back facing the shower head.  Do not remove patch once it has been placed because that will interrupt data and decrease adhesive wear time. Push the button when you have any symptoms and write down what you were feeling. Once you have completed wearing your monitor, remove and place into box which has postage paid and place in your outgoing mailbox.  If for some reason you have misplaced your box then call our office and we can provide another box and/or mail it off for you.    Referral to Dr. Graciela Husbands for dysautonomia    Follow-Up: At [facility], you and your health needs are our priority.  As part of our continuing mission to provide you with exceptional heart care, we have created designated Provider Care Teams.  These Care Teams include your primary Cardiologist (physician) and Advanced Practice Providers (APPs -  Physician Assistants and Nurse Practitioners) who all work together to provide you with the care you need, when you need it.    Your next appointment:   3 month(s)  Provider:   Yvonne Kendall, MD ONLY

## 2023-07-21 ENCOUNTER — Other Ambulatory Visit: Payer: Self-pay | Admitting: Cardiology

## 2023-07-21 DIAGNOSIS — Z8572 Personal history of non-Hodgkin lymphomas: Secondary | ICD-10-CM

## 2023-07-21 DIAGNOSIS — R0602 Shortness of breath: Secondary | ICD-10-CM

## 2023-07-21 DIAGNOSIS — R42 Dizziness and giddiness: Secondary | ICD-10-CM

## 2023-07-21 DIAGNOSIS — I2510 Atherosclerotic heart disease of native coronary artery without angina pectoris: Secondary | ICD-10-CM

## 2023-07-21 DIAGNOSIS — E785 Hyperlipidemia, unspecified: Secondary | ICD-10-CM

## 2023-07-21 DIAGNOSIS — R634 Abnormal weight loss: Secondary | ICD-10-CM

## 2023-07-21 DIAGNOSIS — R5382 Chronic fatigue, unspecified: Secondary | ICD-10-CM

## 2023-07-21 DIAGNOSIS — R002 Palpitations: Secondary | ICD-10-CM

## 2023-07-21 DIAGNOSIS — G901 Familial dysautonomia [Riley-Day]: Secondary | ICD-10-CM

## 2023-07-22 ENCOUNTER — Ambulatory Visit: Payer: Medicare Other | Attending: Cardiology

## 2023-07-22 DIAGNOSIS — I2510 Atherosclerotic heart disease of native coronary artery without angina pectoris: Secondary | ICD-10-CM | POA: Diagnosis not present

## 2023-07-22 DIAGNOSIS — R0602 Shortness of breath: Secondary | ICD-10-CM

## 2023-07-22 LAB — ECHOCARDIOGRAM COMPLETE
Area-P 1/2: 3.31 cm2
S' Lateral: 2.8 cm

## 2023-07-22 MED ORDER — PERFLUTREN LIPID MICROSPHERE
1.0000 mL | INTRAVENOUS | Status: AC | PRN
Start: 2023-07-22 — End: 2023-07-22
  Administered 2023-07-22: 5 mL via INTRAVENOUS

## 2023-07-28 NOTE — Telephone Encounter (Signed)
We can try the preventice one. It works the same way but is a Adult nurse with different shape and adhesive.

## 2023-07-28 NOTE — Progress Notes (Signed)
Heart squeeze is mildly reduced at 45-50%. There is also some stiffness noted to the muscle that can be related to age or high blood pressure, Mild leakage was noted in one of the valves. Recommend moving upcoming appointment to an earlier date to discuss testing results and next steps in testing/treatment.

## 2023-07-28 NOTE — Progress Notes (Signed)
That's fine

## 2023-07-29 ENCOUNTER — Ambulatory Visit: Payer: Medicare Other | Attending: Cardiology

## 2023-07-29 ENCOUNTER — Other Ambulatory Visit: Payer: Self-pay

## 2023-07-29 ENCOUNTER — Encounter: Payer: Self-pay | Admitting: *Deleted

## 2023-07-29 DIAGNOSIS — R0602 Shortness of breath: Secondary | ICD-10-CM

## 2023-07-29 DIAGNOSIS — R002 Palpitations: Secondary | ICD-10-CM

## 2023-07-29 NOTE — Progress Notes (Unsigned)
Patient enrolled for [employer]/ [employer] to ship a 14 day long term holter monitor to the patients address on file.  Dr. Graciela Husbands to read.

## 2023-08-01 DIAGNOSIS — R0602 Shortness of breath: Secondary | ICD-10-CM

## 2023-08-01 DIAGNOSIS — R002 Palpitations: Secondary | ICD-10-CM

## 2023-08-14 NOTE — Progress Notes (Signed)
Sinus with average heart rate 77-82 beats per minute, occasional early beats noted, 5 episodes of fast heart rates, no arrhythmia like atrial fibrillation and no pauses noted

## 2023-08-20 ENCOUNTER — Other Ambulatory Visit: Payer: Self-pay | Admitting: *Deleted

## 2023-08-20 ENCOUNTER — Ambulatory Visit: Payer: Medicare Other | Attending: Internal Medicine | Admitting: Cardiology

## 2023-08-20 ENCOUNTER — Encounter: Payer: Self-pay | Admitting: Cardiology

## 2023-08-20 VITALS — BP 130/76 | HR 88 | Ht 79.0 in | Wt 243.2 lb

## 2023-08-20 DIAGNOSIS — R0602 Shortness of breath: Secondary | ICD-10-CM | POA: Diagnosis not present

## 2023-08-20 DIAGNOSIS — R5382 Chronic fatigue, unspecified: Secondary | ICD-10-CM

## 2023-08-20 DIAGNOSIS — I428 Other cardiomyopathies: Secondary | ICD-10-CM | POA: Diagnosis not present

## 2023-08-20 DIAGNOSIS — R002 Palpitations: Secondary | ICD-10-CM

## 2023-08-20 DIAGNOSIS — Z8572 Personal history of non-Hodgkin lymphomas: Secondary | ICD-10-CM

## 2023-08-20 DIAGNOSIS — I2510 Atherosclerotic heart disease of native coronary artery without angina pectoris: Secondary | ICD-10-CM

## 2023-08-20 DIAGNOSIS — G901 Familial dysautonomia [Riley-Day]: Secondary | ICD-10-CM | POA: Diagnosis not present

## 2023-08-20 DIAGNOSIS — E785 Hyperlipidemia, unspecified: Secondary | ICD-10-CM

## 2023-08-20 DIAGNOSIS — R634 Abnormal weight loss: Secondary | ICD-10-CM

## 2023-08-20 NOTE — H&P (View-Only) (Signed)
Cardiology Office Note:  .   Date:  08/20/2023  ID:  Ryan Roth, DOB October 24, 1956, MRN [id number] PCP: [name], MD  [facility] Providers Cardiologist:  Yvonne Kendall, MD    History of Present Illness: .   Ryan Roth is a 67 y.o. male with a past medical history of coronary disease status post PCI in 2012 at [facility], hypertension, hyperlipidemia, diffuse large B-cell lymphoma, ITP, and BPH, who presents today for follow-up.  Previously been followed by [facility] cardiology.  Previous echocardiogram revealed a dilated RV without evidence of intracardiac shunting.  He was referred to pulmonary and also underwent a right heart catheterization which showed normal left heart filling pressures, mildly elevated right heart filling pressures, borderline low cardiac output/index.  He had his initial MI in 2012 reports he underwent subsequent heart catheterizations at [facility] with his most recent catheterization in 2021 that did not show any obstructive coronary disease.  He was last seen in clinic 07/18/2023 where he had complaints of fatigue, shortness of breath, muscle tightness.  Oxygen saturation at home and dropped into the 80s.Ryan Roth  Heart rate 56 up to 233 bpm per the app on his phone.  He had also recently just been treated for UTI.  He was scheduled for an echocardiogram was placed on a ZIO XT monitor and referred to EP for further evaluation of POTS.  He returns to clinic today accompanied by his wife with continued concerns over fatigue, shortness of breath, tired muscles, GI issues, and tachycardia.  He also has lightheadedness/dizziness, palpitations, increased nausea with vomiting.  He states that he has been compliant with his medication regimen.  Has followed up with GI numerous times and has had several studies completed with no findings.  With his concern over POTS he continues to try to stay hydrated tries not to add too much sodium due to his previous MI. He denies any  hospitalizations or visits to the emergency department.  ROS: 10 point review of systems has been reviewed and considered negative with exception of what is been listed in the HPI  Studies Reviewed: .     Heart Monitor 08/06/23   The patient wore two separate monitors.  Total monitoring time was 4 days, 21 hours.  1 day, 12 hours were suitable for analysis.   The predominant rhythm was sinus with an average rate of 77-82 bpm (range 55-143 bpm).   There were rare PAC's and PVC's.   A total of 5 supraventricular runs were observed, lasting up to 17 beats with a maximum rate of 143 bpm.   No sustained arrhythmia or prolonged pause occurred.   Patient triggered events correspond to sinus rhythm, PAC's, and PVC's.   Predominantly sinus rhythm with rare PAC's and PVC's as well as a few episodes of PSVT, as detailed above.  Monitoring period was brief despite using two separate monitors.    TTE 07/22/23   1. Left ventricular ejection fraction, by estimation, is 45 to 50%. The  left ventricle has mildly decreased function. The left ventricle  demonstrates global hypokinesis. The left ventricular internal cavity size  was mildly dilated. Left ventricular  diastolic parameters are consistent with Grade I diastolic dysfunction  (impaired relaxation).   2. Right ventricular systolic function is normal. The right ventricular  size is normal. Tricuspid regurgitation signal is inadequate for assessing  PA pressure.   3. The mitral valve is normal in structure. Mild mitral valve  regurgitation. No evidence of mitral stenosis.  4. The aortic valve is tricuspid. Aortic valve regurgitation is not  visualized. No aortic stenosis is present.   5. There is borderline dilatation of the aortic root, measuring 39 mm.   6. The inferior vena cava is normal in size with greater than 50%  respiratory variability, suggesting right atrial pressure of 3 mmHg.   RHC (09/20/2022, [facility]): Mean RA 10, RV 25/9, PA  27/14 (19), PCWP 10.  AO saturation 94%, PA saturation 67%.  Fick CO/CI 5.6,2.2.   Echo bubble study (07/22/2022, [facility]): Negative bubble study.   Pharmacologic MPI (07/11/2022, [facility]): Low risk study without evidence of ischemia or scar.  LVEF 63%.   TTE (06/21/2022, [facility]): Mildly dilated left ventricle with mild LVH.  LVEF greater than 55% with normal wall motion.  Grade 1 diastolic dysfunction.  Mildly dilated RV with mildly reduced contraction.  No significant valvular abnormalities.   LHC (11/16/2020, [facility]): Diffuse mild to moderate coronary artery disease.  Proximal and mid RCA were not hemodynamically significant (RFR 0.95).   LHC (12/23/2012, [facility]): Widely patent proximal LAD stent.  25% mid and distal LAD disease as well as involving OM1 and mid RCA.  LVEF 55%.   LHC/PCI (04/01/2011, [facility]): LAD 70% proximal and 50% distal lesions, OM1 30% stenosis, and mid/distal RCA with 50% stenoses.  LVEF 55%.  PCI to proximal LAD using Xience 3.5 x 8 mm DES. Risk Assessment/Calculations:             Physical Exam:   VS:  BP 130/76 (BP Location: Left Arm, Patient Position: Sitting, Cuff Size: Normal)   Pulse 88   Ht 6\' 7"  (2.007 m)   Wt 243 lb 3.2 oz (110.3 kg)   SpO2 97%   BMI 27.40 kg/m    Wt Readings from Last 3 Encounters:  08/20/23 243 lb 3.2 oz (110.3 kg)  07/18/23 244 lb 9.6 oz (110.9 kg)  06/11/23 247 lb 4 oz (112.2 kg)    GEN: Well nourished, well developed in no acute distress NECK: No JVD; No carotid bruits CARDIAC: RRR, no murmurs, rubs, gallops RESPIRATORY:  Clear to auscultation without rales, wheezing or rhonchi  ABDOMEN: Soft, non-tender, non-distended EXTREMITIES:  No edema; No deformity   ASSESSMENT AND PLAN: .   Coronary artery disease with continued shortness of breath and fatigue that is concerning to be anginal equivalent.  Recent echocardiogram shows new cardiomyopathy with a reduced EF of 45-50% where his prior echocardiogram done at [facility] in 06/2022 revealed an  EF of greater than 55%.  Further review of his last report from his last heart catheterization completed in 2021 he had nonobstructive disease to the RCA that was not enough to stent at the time.  With his continued symptoms and drop in the heart function he has been scheduled for a right and left heart catheterization with Dr. Okey Dupre on 08/26/2023.  He was advised that further recommendations and medicine changes will likely be made at that time.  He is continued on aspirin 81 mg daily, lisinopril 20 mg daily, and rosuvastatin 20 mg daily.  Orthostatic hypotension where he continues to have a drop in his blood pressures associated elevated heart rate likely contributing to his lightheadedness.  He is continue to take his doxazosin nightly and states he had not noticed any changes in his symptoms.  Possible dysautonomia where he has been encouraged to maintain adequate hydration.  We did discuss compression sleeves as well as abdominal binders.  He is referred to EP with an upcoming  appointment with Dr. Graciela Husbands for further evaluation.  Hyperlipidemia where he is continued on rosuvastatin 20 mg daily.  Will need a repeat lipid panel on return.  Continued palpitations with bouts of bradycardia noted on monitoring at home.  Patient is worn 2 separate heart monitors revealing predominantly sinus rhythm with rare PACs and PVCs as well as a few episodes of PSVT.  The monitoring period was brief using 2 separate monitors for total time of 4 days and 21 hours 1 day and 12 hours were suitable for analysis.  Due to fatigue and concerns of bradycardia he has not been initiated on beta-blocker therapy.   History of B-cell lymphoma where he continues to be followed with close surveillance by oncology at his team at [facility].  He has continued to suffer from several GI issues with workup from gastroenterology being unrevealing for his unintentional weight loss.    Informed Consent   Shared Decision Making/Informed Consent The  risks [stroke (1 in 1000), death (1 in 1000), kidney failure [usually temporary] (1 in 500), bleeding (1 in 200), allergic reaction [possibly serious] (1 in 200)], benefits (diagnostic support and management of coronary artery disease) and alternatives of a cardiac catheterization were discussed in detail with Mr. Sarsour and he is willing to proceed.     Dispo: Patient to return to clinic to see MD/APP 1-2 weeks after procedure or sooner if needed  Signed, Elysha Daw, NP

## 2023-08-20 NOTE — Patient Instructions (Signed)
Medication Instructions:  Your Physician recommend you continue on your current medication as directed.    *If you need a refill on your cardiac medications before your next appointment, please call your pharmacy*   Lab Work: Your provider would like for you to have following labs drawn today BMET and CBC.    If you have labs (blood work) drawn today and your tests are completely normal, you will receive your results only by: MyChart Message (if you have MyChart) OR A paper copy in the mail If you have any lab test that is abnormal or we need to change your treatment, we will call you to review the results.   Testing/Procedures:  [place] [facility] [place] [address] [place] [address] Dept: [phone] Loc: [phone]  Ryan Roth  08/20/2023  You are scheduled for a Right and Left Cardiac Catheterization on Tuesday, September 17 with Dr. Cristal Deer End.  1. Please arrive at the [facility] Vascular Center Entrance of [facility], [address], [place] [address] at 10:00 AM (This is 1 hour prior to your procedure time).  Proceed to the Check-In Desk directly inside the entrance.  Procedure Parking: Use the entrance off of the [address] side of the hospital. Turn right upon entering and follow the driveway to parking that is directly in front of the Heart & Vascular Center. There is no valet parking available at this entrance, however there is an awning directly in front of the Heart & Vascular Center for drop off/ pick up for patients.  Special note: Every effort is made to have your procedure done on time. Please understand that emergencies sometimes delay scheduled procedures.  2. Diet: Do not eat solid foods after midnight.  The patient may have clear liquids until 5am upon the day of the procedure.  3. Labs: You will have blood drawn today.  4. Medication instructions  in preparation for your procedure:   Contrast Allergy: No   On the morning of your procedure, take your Aspirin 81 mg and any morning medicines NOT listed above.  You may use sips of water.  5. Plan to go home the same day, you will only stay overnight if medically necessary. 6. Bring a current list of your medications and current insurance cards. 7. You MUST have a responsible person to drive you home. 8. Someone MUST be with you the first 24 hours after you arrive home or your discharge will be delayed. 9. Please wear clothes that are easy to get on and off and wear slip-on shoes.  Thank you for allowing [place] to care for you!   -- [facility]    Follow-Up: At [facility], you and your health needs are our priority.  As part of our continuing mission to provide you with exceptional heart care, we have created designated Provider Care Teams.  These Care Teams include your primary Cardiologist (physician) and Advanced Practice Providers (APPs -  Physician Assistants and Nurse Practitioners) who all work together to provide you with the care you need, when you need it.  We recommend signing up for the patient portal called "MyChart".  Sign up information is provided on this After Visit Summary.  MyChart is used to connect with patients for Virtual Visits (Telemedicine).  Patients are able to view lab/test results, encounter notes, upcoming appointments, etc.  Non-urgent messages can be sent to your  provider as well.   To learn more about what you can do with MyChart, go to [email or web address].    Your next appointment:   1 to 2 week(s) after your procedure  Provider:   You may see Yvonne Kendall, MD or one of the following Advanced Practice Providers on your designated Care Team:   Nicolasa Ducking, NP Eula Listen, PA-C Cadence Fransico Michael, PA-C Charlsie Quest, NP

## 2023-08-20 NOTE — Progress Notes (Signed)
Cardiology Office Note:  .   Date:  08/20/2023  ID:  Ryan Roth, DOB Dec 18, 1955, MRN [id number] PCP: [name], MD  [facility] Providers Cardiologist:  Yvonne Kendall, MD    History of Present Illness: .   Ryan Roth is a 67 y.o. male with a past medical history of coronary disease status post PCI in 2012 at [facility], hypertension, hyperlipidemia, diffuse large B-cell lymphoma, ITP, and BPH, who presents today for follow-up.  Previously been followed by [facility] cardiology.  Previous echocardiogram revealed a dilated RV without evidence of intracardiac shunting.  He was referred to pulmonary and also underwent a right heart catheterization which showed normal left heart filling pressures, mildly elevated right heart filling pressures, borderline low cardiac output/index.  He had his initial MI in 2012 reports he underwent subsequent heart catheterizations at [facility] with his most recent catheterization in 2021 that did not show any obstructive coronary disease.  He was last seen in clinic 07/18/2023 where he had complaints of fatigue, shortness of breath, muscle tightness.  Oxygen saturation at home and dropped into the 80s.Marland Kitchen  Heart rate 56 up to 233 bpm per the app on his phone.  He had also recently just been treated for UTI.  He was scheduled for an echocardiogram was placed on a ZIO XT monitor and referred to EP for further evaluation of POTS.  He returns to clinic today accompanied by his wife with continued concerns over fatigue, shortness of breath, tired muscles, GI issues, and tachycardia.  He also has lightheadedness/dizziness, palpitations, increased nausea with vomiting.  He states that he has been compliant with his medication regimen.  Has followed up with GI numerous times and has had several studies completed with no findings.  With his concern over POTS he continues to try to stay hydrated tries not to add too much sodium due to his previous MI. He denies any  hospitalizations or visits to the emergency department.  ROS: 10 point review of systems has been reviewed and considered negative with exception of what is been listed in the HPI  Studies Reviewed: .     Heart Monitor 08/06/23   The patient wore two separate monitors.  Total monitoring time was 4 days, 21 hours.  1 day, 12 hours were suitable for analysis.   The predominant rhythm was sinus with an average rate of 77-82 bpm (range 55-143 bpm).   There were rare PAC's and PVC's.   A total of 5 supraventricular runs were observed, lasting up to 17 beats with a maximum rate of 143 bpm.   No sustained arrhythmia or prolonged pause occurred.   Patient triggered events correspond to sinus rhythm, PAC's, and PVC's.   Predominantly sinus rhythm with rare PAC's and PVC's as well as a few episodes of PSVT, as detailed above.  Monitoring period was brief despite using two separate monitors.    TTE 07/22/23   1. Left ventricular ejection fraction, by estimation, is 45 to 50%. The  left ventricle has mildly decreased function. The left ventricle  demonstrates global hypokinesis. The left ventricular internal cavity size  was mildly dilated. Left ventricular  diastolic parameters are consistent with Grade I diastolic dysfunction  (impaired relaxation).   2. Right ventricular systolic function is normal. The right ventricular  size is normal. Tricuspid regurgitation signal is inadequate for assessing  PA pressure.   3. The mitral valve is normal in structure. Mild mitral valve  regurgitation. No evidence of mitral stenosis.  4. The aortic valve is tricuspid. Aortic valve regurgitation is not  visualized. No aortic stenosis is present.   5. There is borderline dilatation of the aortic root, measuring 39 mm.   6. The inferior vena cava is normal in size with greater than 50%  respiratory variability, suggesting right atrial pressure of 3 mmHg.   RHC (09/20/2022, [facility]): Mean RA 10, RV 25/9, PA  27/14 (19), PCWP 10.  AO saturation 94%, PA saturation 67%.  Fick CO/CI 5.6,2.2.   Echo bubble study (07/22/2022, [facility]): Negative bubble study.   Pharmacologic MPI (07/11/2022, [facility]): Low risk study without evidence of ischemia or scar.  LVEF 63%.   TTE (06/21/2022, [facility]): Mildly dilated left ventricle with mild LVH.  LVEF greater than 55% with normal wall motion.  Grade 1 diastolic dysfunction.  Mildly dilated RV with mildly reduced contraction.  No significant valvular abnormalities.   LHC (11/16/2020, [facility]): Diffuse mild to moderate coronary artery disease.  Proximal and mid RCA were not hemodynamically significant (RFR 0.95).   LHC (12/23/2012, [facility]): Widely patent proximal LAD stent.  25% mid and distal LAD disease as well as involving OM1 and mid RCA.  LVEF 55%.   LHC/PCI (04/01/2011, [facility]): LAD 70% proximal and 50% distal lesions, OM1 30% stenosis, and mid/distal RCA with 50% stenoses.  LVEF 55%.  PCI to proximal LAD using Xience 3.5 x 8 mm DES. Risk Assessment/Calculations:             Physical Exam:   VS:  BP 130/76 (BP Location: Left Arm, Patient Position: Sitting, Cuff Size: Normal)   Pulse 88   Ht 6\' 7"  (2.007 m)   Wt 243 lb 3.2 oz (110.3 kg)   SpO2 97%   BMI 27.40 kg/m    Wt Readings from Last 3 Encounters:  08/20/23 243 lb 3.2 oz (110.3 kg)  07/18/23 244 lb 9.6 oz (110.9 kg)  06/11/23 247 lb 4 oz (112.2 kg)    GEN: Well nourished, well developed in no acute distress NECK: No JVD; No carotid bruits CARDIAC: RRR, no murmurs, rubs, gallops RESPIRATORY:  Clear to auscultation without rales, wheezing or rhonchi  ABDOMEN: Soft, non-tender, non-distended EXTREMITIES:  No edema; No deformity   ASSESSMENT AND PLAN: .   Coronary artery disease with continued shortness of breath and fatigue that is concerning to be anginal equivalent.  Recent echocardiogram shows new cardiomyopathy with a reduced EF of 45-50% where his prior echocardiogram done at [facility] in 06/2022 revealed an  EF of greater than 55%.  Further review of his last report from his last heart catheterization completed in 2021 he had nonobstructive disease to the RCA that was not enough to stent at the time.  With his continued symptoms and drop in the heart function he has been scheduled for a right and left heart catheterization with Dr. Okey Dupre on 08/26/2023.  He was advised that further recommendations and medicine changes will likely be made at that time.  He is continued on aspirin 81 mg daily, lisinopril 20 mg daily, and rosuvastatin 20 mg daily.  Orthostatic hypotension where he continues to have a drop in his blood pressures associated elevated heart rate likely contributing to his lightheadedness.  He is continue to take his doxazosin nightly and states he had not noticed any changes in his symptoms.  Possible dysautonomia where he has been encouraged to maintain adequate hydration.  We did discuss compression sleeves as well as abdominal binders.  He is referred to EP with an upcoming  appointment with Dr. Graciela Husbands for further evaluation.  Hyperlipidemia where he is continued on rosuvastatin 20 mg daily.  Will need a repeat lipid panel on return.  Continued palpitations with bouts of bradycardia noted on monitoring at home.  Patient is worn 2 separate heart monitors revealing predominantly sinus rhythm with rare PACs and PVCs as well as a few episodes of PSVT.  The monitoring period was brief using 2 separate monitors for total time of 4 days and 21 hours 1 day and 12 hours were suitable for analysis.  Due to fatigue and concerns of bradycardia he has not been initiated on beta-blocker therapy.   History of B-cell lymphoma where he continues to be followed with close surveillance by oncology at his team at [facility].  He has continued to suffer from several GI issues with workup from gastroenterology being unrevealing for his unintentional weight loss.    Informed Consent   Shared Decision Making/Informed Consent The  risks [stroke (1 in 1000), death (1 in 1000), kidney failure [usually temporary] (1 in 500), bleeding (1 in 200), allergic reaction [possibly serious] (1 in 200)], benefits (diagnostic support and management of coronary artery disease) and alternatives of a cardiac catheterization were discussed in detail with Mr. Schirra and he is willing to proceed.     Dispo: Patient to return to clinic to see MD/APP 1-2 weeks after procedure or sooner if needed  Signed, Kenae Lindquist, NP

## 2023-08-21 LAB — CBC
Hematocrit: 45.9 % (ref 37.5–51.0)
Hemoglobin: 15 g/dL (ref 13.0–17.7)
MCH: 29.7 pg (ref 26.6–33.0)
MCHC: 32.7 g/dL (ref 31.5–35.7)
MCV: 91 fL (ref 79–97)
Platelets: 281 10*3/uL (ref 150–450)
RBC: 5.05 x10E6/uL (ref 4.14–5.80)
RDW: 14.5 % (ref 11.6–15.4)
WBC: 7.2 10*3/uL (ref 3.4–10.8)

## 2023-08-21 LAB — BASIC METABOLIC PANEL
BUN/Creatinine Ratio: 12 (ref 10–24)
BUN: 13 mg/dL (ref 8–27)
CO2: 23 mmol/L (ref 20–29)
Calcium: 10.2 mg/dL (ref 8.6–10.2)
Chloride: 105 mmol/L (ref 96–106)
Creatinine, Ser: 1.08 mg/dL (ref 0.76–1.27)
Glucose: 73 mg/dL (ref 70–99)
Potassium: 4.6 mmol/L (ref 3.5–5.2)
Sodium: 142 mmol/L (ref 134–144)
eGFR: 75 mL/min/{1.73_m2} (ref >59)

## 2023-08-21 NOTE — Progress Notes (Signed)
Blood work results are all stable. Continue current medication regimen without changes needed at this time.

## 2023-08-25 ENCOUNTER — Telehealth: Payer: Self-pay | Admitting: *Deleted

## 2023-08-25 NOTE — Telephone Encounter (Signed)
Spoke with patient and reviewed instructions, location, and time of arrival. He verbalized understanding of our conversation with no further questions at this time.

## 2023-08-26 ENCOUNTER — Encounter: Payer: Self-pay | Admitting: Internal Medicine

## 2023-08-26 ENCOUNTER — Other Ambulatory Visit: Payer: Self-pay

## 2023-08-26 ENCOUNTER — Encounter
Admission: RE | Disposition: A | Discharge status: Home / Self Care | Payer: Self-pay | Source: Home / Self Care | Attending: Internal Medicine

## 2023-08-26 ENCOUNTER — Ambulatory Visit
Admission: RE | Admit: 2023-08-26 | Discharge: 2023-08-26 | Disposition: A | Discharge status: Home / Self Care | Payer: Medicare Other | Attending: Internal Medicine | Admitting: Internal Medicine

## 2023-08-26 DIAGNOSIS — R0602 Shortness of breath: Secondary | ICD-10-CM | POA: Diagnosis present

## 2023-08-26 DIAGNOSIS — E785 Hyperlipidemia, unspecified: Secondary | ICD-10-CM | POA: Insufficient documentation

## 2023-08-26 DIAGNOSIS — Z955 Presence of coronary angioplasty implant and graft: Secondary | ICD-10-CM | POA: Insufficient documentation

## 2023-08-26 DIAGNOSIS — I951 Orthostatic hypotension: Secondary | ICD-10-CM | POA: Diagnosis not present

## 2023-08-26 DIAGNOSIS — I429 Cardiomyopathy, unspecified: Secondary | ICD-10-CM | POA: Insufficient documentation

## 2023-08-26 DIAGNOSIS — Z8572 Personal history of non-Hodgkin lymphomas: Secondary | ICD-10-CM | POA: Insufficient documentation

## 2023-08-26 DIAGNOSIS — I428 Other cardiomyopathies: Secondary | ICD-10-CM

## 2023-08-26 DIAGNOSIS — Z7982 Long term (current) use of aspirin: Secondary | ICD-10-CM | POA: Diagnosis not present

## 2023-08-26 DIAGNOSIS — I10 Essential (primary) hypertension: Secondary | ICD-10-CM | POA: Diagnosis not present

## 2023-08-26 DIAGNOSIS — I2510 Atherosclerotic heart disease of native coronary artery without angina pectoris: Secondary | ICD-10-CM | POA: Insufficient documentation

## 2023-08-26 DIAGNOSIS — N400 Enlarged prostate without lower urinary tract symptoms: Secondary | ICD-10-CM | POA: Diagnosis not present

## 2023-08-26 DIAGNOSIS — I252 Old myocardial infarction: Secondary | ICD-10-CM | POA: Insufficient documentation

## 2023-08-26 DIAGNOSIS — Z79899 Other long term (current) drug therapy: Secondary | ICD-10-CM | POA: Diagnosis not present

## 2023-08-26 DIAGNOSIS — D693 Immune thrombocytopenic purpura: Secondary | ICD-10-CM | POA: Diagnosis not present

## 2023-08-26 HISTORY — PX: RIGHT/LEFT HEART CATH AND CORONARY ANGIOGRAPHY: CATH118266

## 2023-08-26 LAB — POCT I-STAT 7, (LYTES, BLD GAS, ICA,H+H)
Acid-base deficit: 2 mmol/L (ref 0.0–2.0)
Bicarbonate: 23.4 mmol/L (ref 20.0–28.0)
Calcium, Ion: 1.22 mmol/L (ref 1.15–1.40)
HCT: 42 % (ref 39.0–52.0)
Hemoglobin: 14.3 g/dL (ref 13.0–17.0)
O2 Saturation: 96 %
Potassium: 3.5 mmol/L (ref 3.5–5.1)
Sodium: 141 mmol/L (ref 135–145)
TCO2: 25 mmol/L (ref 22–32)
pCO2 arterial: 40.4 mmHg (ref 32–48)
pH, Arterial: 7.37 (ref 7.35–7.45)
pO2, Arterial: 85 mmHg (ref 83–108)

## 2023-08-26 SURGERY — RIGHT/LEFT HEART CATH AND CORONARY ANGIOGRAPHY
Anesthesia: Moderate Sedation | Laterality: Bilateral

## 2023-08-26 MED ORDER — IOHEXOL 300 MG/ML  SOLN
INTRAMUSCULAR | Status: DC | PRN
  Administered 2023-08-26: 37 mL

## 2023-08-26 MED ORDER — SODIUM CHLORIDE 0.9 % IV SOLN: 250.0000 mL | INTRAVENOUS | Status: DC | PRN

## 2023-08-26 MED ORDER — HEPARIN (PORCINE) IN NACL 2000-0.9 UNIT/L-% IV SOLN
INTRAVENOUS | Status: DC | PRN
  Administered 2023-08-26: 1000 mL

## 2023-08-26 MED ORDER — SODIUM CHLORIDE 0.9 % IV SOLN: INTRAVENOUS | Status: DC

## 2023-08-26 MED ORDER — ASPIRIN 81 MG PO CHEW
81.0000 mg | CHEWABLE_TABLET | ORAL | Status: AC
  Administered 2023-08-26: 81 mg via ORAL

## 2023-08-26 MED ORDER — HEPARIN (PORCINE) IN NACL 1000-0.9 UT/500ML-% IV SOLN
INTRAVENOUS | Status: AC
  Filled 2023-08-26: qty 1000

## 2023-08-26 MED ORDER — LIDOCAINE HCL 1 % IJ SOLN
INTRAMUSCULAR | Status: AC
  Filled 2023-08-26: qty 20

## 2023-08-26 MED ORDER — HEPARIN SODIUM (PORCINE) 1000 UNIT/ML IJ SOLN
INTRAMUSCULAR | Status: DC | PRN
  Administered 2023-08-26: 5000 [IU] via INTRAVENOUS

## 2023-08-26 MED ORDER — HYDRALAZINE HCL 20 MG/ML IJ SOLN: 10.0000 mg | INTRAMUSCULAR | Status: DC | PRN

## 2023-08-26 MED ORDER — LIDOCAINE HCL (PF) 1 % IJ SOLN
INTRAMUSCULAR | Status: DC | PRN
  Administered 2023-08-26 (×2): 2 mL

## 2023-08-26 MED ORDER — FENTANYL CITRATE (PF) 100 MCG/2ML IJ SOLN
INTRAMUSCULAR | Status: DC | PRN
  Administered 2023-08-26: 50 ug via INTRAVENOUS

## 2023-08-26 MED ORDER — FENTANYL CITRATE (PF) 100 MCG/2ML IJ SOLN
INTRAMUSCULAR | Status: AC
  Filled 2023-08-26: qty 2

## 2023-08-26 MED ORDER — SODIUM CHLORIDE 0.9% FLUSH: 3.0000 mL | INTRAVENOUS | Status: DC | PRN

## 2023-08-26 MED ORDER — SODIUM CHLORIDE 0.9% FLUSH: 3.0000 mL | Freq: Two times a day (BID) | INTRAVENOUS | Status: DC

## 2023-08-26 MED ORDER — MIDAZOLAM HCL 2 MG/2ML IJ SOLN
INTRAMUSCULAR | Status: AC
  Filled 2023-08-26: qty 2

## 2023-08-26 MED ORDER — ONDANSETRON HCL 4 MG/2ML IJ SOLN: 4.0000 mg | Freq: Four times a day (QID) | INTRAMUSCULAR | Status: DC | PRN

## 2023-08-26 MED ORDER — MIDAZOLAM HCL 2 MG/2ML IJ SOLN
INTRAMUSCULAR | Status: DC | PRN
  Administered 2023-08-26: 1 mg via INTRAVENOUS

## 2023-08-26 MED ORDER — VERAPAMIL HCL 2.5 MG/ML IV SOLN
INTRAVENOUS | Status: AC
  Filled 2023-08-26: qty 2

## 2023-08-26 MED ORDER — HEPARIN SODIUM (PORCINE) 1000 UNIT/ML IJ SOLN
INTRAMUSCULAR | Status: AC
  Filled 2023-08-26: qty 10

## 2023-08-26 MED ORDER — ASPIRIN 81 MG PO CHEW
CHEWABLE_TABLET | ORAL | Status: AC
  Filled 2023-08-26: qty 1

## 2023-08-26 MED ORDER — SODIUM CHLORIDE 0.9 % IV SOLN
INTRAVENOUS | Status: DC | PRN
  Administered 2023-08-26: 250 mL via INTRAVENOUS

## 2023-08-26 MED ORDER — VERAPAMIL HCL 2.5 MG/ML IV SOLN
INTRAVENOUS | Status: DC | PRN
  Administered 2023-08-26 (×2): 2.5 mg via INTRA_ARTERIAL

## 2023-08-26 MED ORDER — ACETAMINOPHEN 325 MG PO TABS: 650.0000 mg | ORAL_TABLET | ORAL | Status: DC | PRN

## 2023-08-26 MED ORDER — LABETALOL HCL 5 MG/ML IV SOLN: 10.0000 mg | INTRAVENOUS | Status: DC | PRN

## 2023-08-26 SURGICAL SUPPLY — 12 items
CATH 5FR JL3.5 JR4 ANG PIG MP (CATHETERS) IMPLANT
CATH BALLN WEDGE 5F 110CM (CATHETERS) IMPLANT
DEVICE RAD TR BAND REGULAR (VASCULAR PRODUCTS) IMPLANT
DRAPE BRACHIAL (DRAPES) IMPLANT
GLIDESHEATH SLEND A-KIT 6F 22G (SHEATH) IMPLANT
GUIDEWIRE INQWIRE 1.5J.035X260 (WIRE) IMPLANT
INQWIRE 1.5J .035X260CM (WIRE) ×1
PACK CARDIAC CATH (CUSTOM PROCEDURE TRAY) ×1 IMPLANT
PROTECTION STATION PRESSURIZED (MISCELLANEOUS) ×1
SET ATX-X65L (MISCELLANEOUS) IMPLANT
SHEATH GLIDE SLENDER 4/5FR (SHEATH) IMPLANT
STATION PROTECTION PRESSURIZED (MISCELLANEOUS) IMPLANT

## 2023-08-26 NOTE — Interval H&P Note (Signed)
History and Physical Interval Note:  08/26/2023 11:08 AM  Ryan Roth  has presented today for surgery, with the diagnosis of cardiomyopathy and shortness of breath.  The various methods of treatment have been discussed with the patient and family. After consideration of risks, benefits and other options for treatment, the patient has consented to  Procedure(s): RIGHT/LEFT HEART CATH AND CORONARY ANGIOGRAPHY (Bilateral) as a surgical intervention.  The patient's history has been reviewed, patient examined, no change in status, stable for surgery.  I have reviewed the patient's chart and labs.  Questions were answered to the patient's satisfaction.    Cath Lab Visit (complete for each Cath Lab visit)  Clinical Evaluation Leading to the Procedure:   ACS: No.  Non-ACS:    Anginal/Heart Failure Classification: NYHA class III  Anti-ischemic medical therapy: No Therapy  Non-Invasive Test Results: None; LVEF mildly reduced by echo -> intermediate risk  Prior CABG: No previous CABG  Bess Saltzman

## 2023-08-26 NOTE — Brief Op Note (Signed)
BRIEF CARDIAC CATHETERIZATION NOTE  08/26/2023  11:53 AM  PATIENT:  Ryan Roth  67 y.o. male  PRE-OPERATIVE DIAGNOSIS:  Cardiomyopathy and shortness of breath  POST-OPERATIVE DIAGNOSIS:  Same  PROCEDURE:  Procedure(s): RIGHT/LEFT HEART CATH AND CORONARY ANGIOGRAPHY (Bilateral)  SURGEON:  Surgeons and Role:    Yvonne Kendall, MD - Primary  FINDINGS: Non-obstructive CAD with patient proximal LAD stent. Normal left and right heart filling pressures. Normal Fick cardiac output/index.  RECOMMENDATIONS: Continue secondary prevention of CAD. Optimize GDMT as tolerated for NICM.  Yvonne Kendall, MD [facility]

## 2023-08-27 ENCOUNTER — Encounter: Payer: Self-pay | Admitting: Internal Medicine

## 2023-08-29 NOTE — Progress Notes (Signed)
Average heart rate of 83 bpm with rare early beats called PACs or PVCs.  There was no sustained arrhythmia or prolonged pauses and no atrial fibrillation noted.  Continue to keep scheduled appointment with EP.

## 2023-09-09 ENCOUNTER — Ambulatory Visit: Payer: Medicare Other | Attending: Cardiology | Admitting: Cardiology

## 2023-09-09 ENCOUNTER — Encounter: Payer: Self-pay | Admitting: Cardiology

## 2023-09-09 VITALS — BP 148/84 | HR 66 | Ht 79.0 in | Wt 243.8 lb

## 2023-09-09 DIAGNOSIS — R42 Dizziness and giddiness: Secondary | ICD-10-CM

## 2023-09-09 DIAGNOSIS — I5022 Chronic systolic (congestive) heart failure: Secondary | ICD-10-CM

## 2023-09-09 DIAGNOSIS — E785 Hyperlipidemia, unspecified: Secondary | ICD-10-CM | POA: Diagnosis not present

## 2023-09-09 DIAGNOSIS — R002 Palpitations: Secondary | ICD-10-CM

## 2023-09-09 DIAGNOSIS — I429 Cardiomyopathy, unspecified: Secondary | ICD-10-CM | POA: Diagnosis not present

## 2023-09-09 DIAGNOSIS — I2510 Atherosclerotic heart disease of native coronary artery without angina pectoris: Secondary | ICD-10-CM

## 2023-09-09 DIAGNOSIS — Z8572 Personal history of non-Hodgkin lymphomas: Secondary | ICD-10-CM

## 2023-09-09 NOTE — Progress Notes (Signed)
Cardiology Office Note:  .   Date:  09/09/2023  ID:  Ryan Roth, DOB [birth year], MRN [id number] PCP: [name], MD  [facility] Providers Cardiologist:  Yvonne Kendall, MD    History of Present Illness: .   Ryan Roth is a 67 y.o. male with a past medical history of coronary disease status post PCI in 2012 at [facility], hypertension, hyperlipidemia, diffuse large B-cell lymphoma, ITP, BPH, who presents today for follow-up after recent right and left heart catheterization.  Previously was followed by [facility] cardiology with previous echocardiogram revealing a dilated RV without evidence of intracardiac shunting.  He was referred to pulmonary and also underwent a right heart catheterization which showed normal left heart filling pressures, mildly elevated right heart filling pressures, borderline low cardiac output/index.  He had his initial MI in 2012 reports he underwent subsequent heart catheterizations at [facility] with an additional catheterization completed in 2021 which did not show any obstructive coronary disease.  He was last seen in clinic 08/20/2023 with concerns over fatigue, shortness of breath, tired muscles, GI issues, and tachycardia.  He had also endorsed lightheadedness/dizziness, palpitations, increased nausea with vomiting.  States he has been compliant with his medication regimen and follow-up with GI numerous times and had several studies completed that were unrevealing.  He was scheduled for a right and left heart catheterization on 08/26/2023.  He was also encouraged to continue with maintaining adequate hydration and possible compression sleeves and referred to EP for possible dysautonomia.  He returns clinic today with continued concerns of fatigue, shortness of breath, GI issues, and tachycardia with associated lightheadedness/dizziness, palpitations.  He recently underwent right and left heart catheterization which did not reveal any obstructive disease  and was continued with medical management.  He also wore a heart monitor for 6 days and 17 hours with average heart rate of 83 bpm. Patient had previously been on metoprolol for 10 years and was recently discontinued by [facility] last summer.  He continues to lean toward a possible diagnosis of POTS as he has been having the symptoms for over about a year and a half.  Unfortunately he also sustained a fall at home last week carrying the dog outside.  He stated he slipped on the steps that were wet and has horrible bruising.  ROS: 10 point review of systems has been reviewed and considered negative with exception of what is been listed in the HPI  Studies Reviewed: Marland Kitchen   EKG Interpretation Date/Time:  Tuesday September 09 2023 11:02:11 EDT Ventricular Rate:  66 PR Interval:  186 QRS Duration:  102 QT Interval:  394 QTC Calculation: 413 R Axis:   -47  Text Interpretation: Sinus rhythm with marked sinus arrhythmia Left anterior fascicular block When compared with ECG of 26-Aug-2023 10:21, PREVIOUS ECG IS PRESENT Confirmed by Charlsie Quest ([id number]) on 09/09/2023 11:12:41 AM   Heart Monitor 08/20/2023   The patient was monitored for 6 days, 17 hours.   The predominant rhythm was sinus with an average rate of 83 bpm (range 50-143 bpm).   There were rare PAC's and PVC's.   No sustained arrhythmia or prolonged pause was identified.   Patient triggered events correspond to sinus rhythm, PAC's, and PVC's.   Predominantly sinus rhythm with rare PAC's and PVC's.  No significant arrhythmia identified.  R/LHC 08/26/23 Conclusions: Mild to moderate, nonobstructive coronary artery disease, including mild luminal irregularities throughout the LAD, 30% stenosis of large OM1 branch, and sequential 40-50% proximal and  50-60% distal RCA lesions. Suspect patent stent in the proximal LAD (stent struts not well-visualized). Normal left heart, right heart, and pulmonary artery pressures. Normal Fick cardiac  output/index.   Recommendations: Continue medical therapy and risk factor modification to prevent progression of CAD. Escalate goal-directed medical therapy for mildly reduced LVEF that appears nonischemic in nature. Consider further de-escalation/discontinuation of doxazosin, given continued concern for orthostatic lightheadedness.  Heart Monitor 08/06/23   The patient wore two separate monitors.  Total monitoring time was 4 days, 21 hours.  1 day, 12 hours were suitable for analysis.   The predominant rhythm was sinus with an average rate of 77-82 bpm (range 55-143 bpm).   There were rare PAC's and PVC's.   A total of 5 supraventricular runs were observed, lasting up to 17 beats with a maximum rate of 143 bpm.   No sustained arrhythmia or prolonged pause occurred.   Patient triggered events correspond to sinus rhythm, PAC's, and PVC's.   Predominantly sinus rhythm with rare PAC's and PVC's as well as a few episodes of PSVT, as detailed above.  Monitoring period was brief despite using two separate monitors.   TTE 07/22/23   1. Left ventricular ejection fraction, by estimation, is 45 to 50%. The  left ventricle has mildly decreased function. The left ventricle  demonstrates global hypokinesis. The left ventricular internal cavity size  was mildly dilated. Left ventricular  diastolic parameters are consistent with Grade I diastolic dysfunction  (impaired relaxation).   2. Right ventricular systolic function is normal. The right ventricular  size is normal. Tricuspid regurgitation signal is inadequate for assessing  PA pressure.   3. The mitral valve is normal in structure. Mild mitral valve  regurgitation. No evidence of mitral stenosis.   4. The aortic valve is tricuspid. Aortic valve regurgitation is not  visualized. No aortic stenosis is present.   5. There is borderline dilatation of the aortic root, measuring 39 mm.   6. The inferior vena cava is normal in size with greater than  50%  respiratory variability, suggesting right atrial pressure of 3 mmHg.    RHC (09/20/2022, [facility]): Mean RA 10, RV 25/9, PA 27/14 (19), PCWP 10.  AO saturation 94%, PA saturation 67%.  Fick CO/CI 5.6,2.2.   Echo bubble study (07/22/2022, [facility]): Negative bubble study.   Pharmacologic MPI (07/11/2022, [facility]): Low risk study without evidence of ischemia or scar.  LVEF 63%.   TTE (06/21/2022, [facility]): Mildly dilated left ventricle with mild LVH.  LVEF greater than 55% with normal wall motion.  Grade 1 diastolic dysfunction.  Mildly dilated RV with mildly reduced contraction.  No significant valvular abnormalities.   LHC (11/16/2020, [facility]): Diffuse mild to moderate coronary artery disease.  Proximal and mid RCA were not hemodynamically significant (RFR 0.95).   LHC (12/23/2012, [facility]): Widely patent proximal LAD stent.  25% mid and distal LAD disease as well as involving OM1 and mid RCA.  LVEF 55%.   LHC/PCI (04/01/2011, [facility]): LAD 70% proximal and 50% distal lesions, OM1 30% stenosis, and mid/distal RCA with 50% stenoses.  LVEF 55%.  PCI to proximal LAD using Xience 3.5 x 8 mm DES. Risk Assessment/Calculations:          Physical Exam:   VS:  BP (!) 148/84 (BP Location: Left Arm, Patient Position: Sitting, Cuff Size: Normal)   Pulse 66   Ht 6\' 7"  (2.007 m)   Wt 243 lb 12.8 oz (110.6 kg)   SpO2 98%   BMI 27.47  kg/m    Wt Readings from Last 3 Encounters:  09/09/23 243 lb 12.8 oz (110.6 kg)  08/26/23 243 lb 12.8 oz (110.6 kg)  08/20/23 243 lb 3.2 oz (110.3 kg)    GEN: Well nourished, well developed in no acute distress NECK: No JVD; No carotid bruits CARDIAC: RRR, no murmurs, rubs, gallops RESPIRATORY:  Clear to auscultation without rales, wheezing or rhonchi  ABDOMEN: Soft, non-tender, non-distended EXTREMITIES:  No edema; No deformity   ASSESSMENT AND PLAN: .   Coronary artery disease without angina but continues to have shortness of breath and fatigue.  Recent right and left heart  catheterization revealed nonobstructive disease and the recommendation was continued with medical management.  Was recommended to escalate medical management continue with secondary prevention.  EKG today reveals sinus with sinus arrhythmia rate of 66 with a left anterior fascicular block with no ischemic changes noted.  Patient is continued on aspirin 81 mg daily and rosuvastatin 20 mg at bedtime.  He also has Nitrostat 0.4 mg sublingual as needed.  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  Prior echocardiogram completed in 06/2022 revealed EF of greater than 55%.  He is euvolemic on exam but continues to suffer from NYHA II symptoms. Recent heart catheterization revealed that reduced function was nonischemic in nature.  It was recommended to continue to continue to escalate GDMT.  Patient is continued on lisinopril 20 mg daily.  Previously been on beta-blocker therapy was discontinued last year for orthostatic hypotension.  Prior to initiating medications he is awaiting follow-up with EP later this week.  Orthostatic hypotension with associated lightheadedness and dizziness.  He has stopped taking his doxazosin with no improvement in symptoms.  Hyperlipidemia where he is continued on rosuvastatin 20 mg daily.  He has LDL of 48.   Continued palpitations with a recent monitor revealing an average heart rate of 83 bpm.  Predominant underlying rhythm was sinus there were rare PACs and PVCs.  There were no sustained arrhythmia or prolonged pauses that were identified.  Previously had been on metoprolol which was stopped last year.  He has never been on carvedilol or nebivolol.  We did discuss beta-blocker therapy and he states that he would rather wait until his EP appointment to rule out his dysautonomia prior to trying any new medications.  History of B-cell lymphoma where he continues to have close follow-up with surveillance by his oncology team at [facility].       Dispo: Patient to return to  clinic to see Dr. Okey Dupre or APP in 1 month or sooner if needed for reevaluation of symptoms.  He also has upcoming appointment with EP later this week for the evaluation of possible dysautonomia.  Signed, Tyronne Blann, NP

## 2023-09-09 NOTE — Patient Instructions (Signed)
Medication Instructions:  Your physician recommends that you continue on your current medications as directed. Please refer to the Current Medication list given to you today.  *If you need a refill on your cardiac medications before your next appointment, please call your pharmacy*  Lab Work: Your provider would like for you to have following labs drawn today CBC & BMET.   If you have labs (blood work) drawn today and your tests are completely normal, you will receive your results only by: MyChart Message (if you have MyChart) OR A paper copy in the mail If you have any lab test that is abnormal or we need to change your treatment, we will call you to review the results.  Testing/Procedures: -None ordered  Follow-Up: At [facility], you and your health needs are our priority.  As part of our continuing mission to provide you with exceptional heart care, we have created designated Provider Care Teams.  These Care Teams include your primary Cardiologist (physician) and Advanced Practice Providers (APPs -  Physician Assistants and Nurse Practitioners) who all work together to provide you with the care you need, when you need it.  Your next appointment:   1 month   Provider:   Yvonne Kendall, MD    Other Instructions -None

## 2023-09-10 LAB — BASIC METABOLIC PANEL
BUN/Creatinine Ratio: 12 (ref 10–24)
BUN: 14 mg/dL (ref 8–27)
CO2: 25 mmol/L (ref 20–29)
Calcium: 9.7 mg/dL (ref 8.6–10.2)
Chloride: 103 mmol/L (ref 96–106)
Creatinine, Ser: 1.2 mg/dL (ref 0.76–1.27)
Glucose: 81 mg/dL (ref 70–99)
Potassium: 4.6 mmol/L (ref 3.5–5.2)
Sodium: 142 mmol/L (ref 134–144)
eGFR: 66 mL/min/{1.73_m2} (ref >59)

## 2023-09-10 LAB — CBC
Hematocrit: 44.4 % (ref 37.5–51.0)
Hemoglobin: 14.7 g/dL (ref 13.0–17.7)
MCH: 30.1 pg (ref 26.6–33.0)
MCHC: 33.1 g/dL (ref 31.5–35.7)
MCV: 91 fL (ref 79–97)
Platelets: 327 10*3/uL (ref 150–450)
RBC: 4.88 x10E6/uL (ref 4.14–5.80)
RDW: 14.1 % (ref 11.6–15.4)
WBC: 7.8 10*3/uL (ref 3.4–10.8)

## 2023-09-10 NOTE — Progress Notes (Signed)
Labs are unremarkable postprocedure.  Continue current medication regimen without changes at this time.

## 2023-09-11 ENCOUNTER — Ambulatory Visit: Payer: Medicare Other | Attending: Internal Medicine | Admitting: Internal Medicine

## 2023-09-11 ENCOUNTER — Encounter: Payer: Self-pay | Admitting: Internal Medicine

## 2023-09-11 VITALS — BP 154/76 | HR 55 | Ht 79.0 in | Wt 242.2 lb

## 2023-09-11 DIAGNOSIS — I429 Cardiomyopathy, unspecified: Secondary | ICD-10-CM | POA: Diagnosis not present

## 2023-09-11 DIAGNOSIS — I5022 Chronic systolic (congestive) heart failure: Secondary | ICD-10-CM | POA: Diagnosis not present

## 2023-09-11 DIAGNOSIS — R42 Dizziness and giddiness: Secondary | ICD-10-CM

## 2023-09-11 DIAGNOSIS — R002 Palpitations: Secondary | ICD-10-CM

## 2023-09-11 NOTE — Patient Instructions (Addendum)
Medication Instructions:  Your physician recommends that you continue on your current medications as directed. Please refer to the Current Medication list given to you today.   *If you need a refill on your cardiac medications before your next appointment, please call your pharmacy*   Lab Work: No labs ordered today    Testing/Procedures: No test ordered today    Follow-Up: At [facility], you and your health needs are our priority.  As part of our continuing mission to provide you with exceptional heart care, we have created designated Provider Care Teams.  These Care Teams include your primary Cardiologist (physician) and Advanced Practice Providers (APPs -  Physician Assistants and Nurse Practitioners) who all work together to provide you with the care you need, when you need it.  We recommend signing up for the patient portal called "MyChart".  Sign up information is provided on this After Visit Summary.  MyChart is used to connect with patients for Virtual Visits (Telemedicine).  Patients are able to view lab/test results, encounter notes, upcoming appointments, etc.  Non-urgent messages can be sent to your provider as well.   To learn more about what you can do with MyChart, go to [email or web address].    Your next appointment:   3 month(s) (Telephone visit)  Provider:   Sherryl Manges, MD    Dr. Graciela Husbands recommends purchasing an abdominal binder

## 2023-09-11 NOTE — Progress Notes (Signed)
ELECTROPHYSIOLOGY CONSULT NOTE  Patient ID: Ryan Roth, MRN: [id number], DOB/AGE: 02/25/1956 67 y.o. Admit date: (Not on file) Date of Consult: 09/12/2023  Primary Physician: [name], MD Primary Cardiologist: [name] is a 67 y.o. male who is being seen today for the evaluation of Orthostasis at the request of Dr CE.   Chief Complaint: lightheadedness and shortness of breath   HPI Ryan Roth is a 67 y.o. male referred for possible dysautonomia.  Because of complaints of dizziness  Has a history of coronary disease with catheterizations at [facility] apparently demonstrating MI 2012 and repeat catheterization 2021 showing no obstructive coronary disease echocardiogram had demonstrated right ventricular enlargement without evidence of intracardiac shunting right heart catheterization was apparently unrevealing  Event recorder 9/24 demonstrated rare PVCs and PACs but no sustained arrhythmia, no pauses and no for A-fib  Orthostatic vital signs 7/24 were notable for change in blood pressure 135--92 and a change in pulse 66--99 consistent with orthostatic hypotension  Hx of Lymphoma with R CHOP  Dyspnea particularly with upper extremitty effort;  Hx of carpal tunnel  Also history of dysphagia (?) describing food getting stuck triggering coughing but only having thick secretions be regurgitated without food  Known COPD    DATE TEST EF   8/24 `Echo   45-50 % RV fn and size normal No LVH aortic valve normal  9/24 L/RHC  Nonobstruc CAD Normal pressures        Date Cr K Hgb  10/24 1.2 4.6 14.7          Past Medical History:  Diagnosis Date   Arthritis    Complication of anesthesia    BP "bottomed out" during septoplasty   Coronary artery disease    Dental bridge present    lower right   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   Hypertension    Hyperthyroidism 2000   Radioactive Iodine Treatments   Hypothyroidism    Lumbar stenosis     Myocardial infarction (HCC)    2012, 2014   Thyroid disease    Wears hearing aid in both ears    Has, does not wear       Surgical History:  Past Surgical History:  Procedure Laterality Date   APPENDECTOMY     BALLOON DILATION N/A 02/21/2020   Procedure: BALLOON DILATION;  Surgeon: Midge Minium, MD;  Location: [facility];  Service: Endoscopy;  Laterality: N/A;   CORONARY ANGIOPLASTY WITH STENT PLACEMENT  2012   ESOPHAGOGASTRODUODENOSCOPY (EGD) WITH PROPOFOL N/A 02/21/2020   Procedure: ESOPHAGOGASTRODUODENOSCOPY (EGD) WITH PROPOFOL;  Surgeon: Midge Minium, MD;  Location: [facility];  Service: Endoscopy;  Laterality: N/A;   RIGHT/LEFT HEART CATH AND CORONARY ANGIOGRAPHY Bilateral 08/26/2023   Procedure: RIGHT/LEFT HEART CATH AND CORONARY ANGIOGRAPHY;  Surgeon: Yvonne Kendall, MD;  Location: [facility] IN[facility];  Service: Cardiovascular;  Laterality: Bilateral;   SEPTOPLASTY  09/04/2018   [facility]   SPLENECTOMY  1990     Current Meds  Medication Sig   aspirin 81 MG chewable tablet Chew by mouth.   Cyanocobalamin (B-12) 1000 MCG TABS Take 1,000 mcg by mouth daily.   levothyroxine (SYNTHROID) 150 MCG tablet Take 150 mcg by mouth daily before breakfast.   lisinopril (ZESTRIL) 20 MG tablet Take 20 mg by mouth daily.   nitroGLYCERIN (NITROSTAT) 0.4 MG SL tablet Place under the tongue.   rosuvastatin (CRESTOR) 20 MG tablet Take 20 mg by mouth at bedtime.  tadalafil (CIALIS) 5 MG tablet Take 5 mg by mouth daily as needed.      Allergies:  Allergies  Allergen Reactions   Cefazolin Itching    Other reaction(s): Itching   Clindamycin Hives, Itching and Rash   Penicillin G Itching    Rash with itch    Social History   Socioeconomic History   Marital status: Married    Spouse name: Not on file   Number of children: Not on file   Years of education: Not on file   Highest education level: Not on file  Occupational History   Not on file  Tobacco Use   Smoking  status: Former    Current packs/day: 0.00    Types: Cigarettes    Quit date: 2018    Years since quitting: 6.7   Smokeless tobacco: Never  Vaping Use   Vaping status: Every Day   Start date: 12/09/2017   Substances: Flavoring, Nicotine-salt   Devices: Smok  Substance and Sexual Activity   Alcohol use: Yes    Alcohol/week: 14.0 standard drinks of alcohol    Types: 7 Cans of beer, 7 Shots of liquor per week    Comment: Currently 2-3 drinks/day, usually 2-3 drinks per week   Drug use: Never   Sexual activity: Not on file  Other Topics Concern   Not on file  Social History Narrative   Not on file   Social Determinants of Health   Financial Resource Strain: Low Risk  (01/28/2023)   Received from [facility], [facility]   Overall Financial Resource Strain (CARDIA)    Difficulty of Paying Living Expenses: Not hard at all  Food Insecurity: No Food Insecurity (01/28/2023)   Received from [facility], [facility]   Hunger Vital Sign    Worried About Running Out of Food in the Last Year: Never true    Ran Out of Food in the Last Year: Never true  Transportation Needs: No Transportation Needs (01/28/2023)   Received from [facility], [facility]   PRAPARE - Transportation    Lack of Transportation (Medical): No    Lack of Transportation (Non-Medical): No  Physical Activity: Unknown (11/27/2022)   Received from [facility], [facility]   Exercise Vital Sign    Days of Exercise per Week: 0 days    Minutes of Exercise per Session: Not on file  Stress: Stress Concern Present (11/27/2022)   Received from [facility], [facility] of Occupational Health - Occupational Stress Questionnaire    Feeling of Stress : To some extent  Social Connections: Moderately Integrated (11/27/2022)   Received from [facility], [facility]   Social Network    How would you rate your social network (family, work, friends)?: Adequate participation with social  networks  Intimate Partner Violence: Not At Risk (11/27/2022)   Received from [facility], [facility]   HITS    Over the last 12 months how often did your partner physically hurt you?: 1    Over the last 12 months how often did your partner insult you or talk down to you?: 1    Over the last 12 months how often did your partner threaten you with physical harm?: 1    Over the last 12 months how often did your partner scream or curse at you?: 1     Family History  Problem Relation Age of Onset   Hypertension Mother    Stroke Mother    Cancer Father  Coronary artery disease Father 51       CABG     ROS:  Please see the history of present illness.     All other systems reviewed and negative.    Physical Exam: Blood pressure (!) 154/76, pulse (!) 55, height 6\' 7"  (2.007 m), weight 242 lb 3.2 oz (109.9 kg), SpO2 97%. General: Well developed, well nourished male in no acute distress. Head: Normocephalic, atraumatic, sclera non-icteric, no xanthomas, nares are without discharge. EENT: normal Lymph Nodes:  none Back: without scoliosis/kyphosis, no CVA tendersness Neck: Negative for carotid bruits. JVD not elevated. Lungs: Clear bilaterally to auscultation without wheezes, rales, or rhonchi. Breathing is unlabored. Heart: RRR with S1 S2. no murmur , rubs, or gallops appreciated. Abdomen: Soft, non-tender, non-distended with normoactive bowel sounds. No hepatomegaly. No rebound/guarding. No obvious abdominal masses. Msk:  Strength and tone appear normal for age. Extremities: No clubbing or cyanosis. No  edema.  Distal pedal pulses are 2+ and equal bilaterally. Skin: Warm and Dry Neuro: Alert and oriented X 3. CN III-XII intact Grossly normal sensory and motor function . Psych:  Responds to questions appropriately with a normal affect.      Labs: Cardiac Enzymes No results for input(s): "CKTOTAL", "CKMB", "TROPONINI" in the last 72 hours. CBC Lab Results  Component Value Date    WBC 7.8 09/09/2023   HGB 14.7 09/09/2023   HCT 44.4 09/09/2023   MCV 91 09/09/2023   PLT 327 09/09/2023   PROTIME: No results for input(s): "LABPROT", "INR" in the last 72 hours. Chemistry  Recent Labs  Lab 09/09/23 1141  NA 142  K 4.6  CL 103  CO2 25  BUN 14  CREATININE 1.20  CALCIUM 9.7  GLUCOSE 81   Lipids No results found for: "CHOL", "HDL", "LDLCALC", "TRIG" BNP No results found for: "PROBNP" Thyroid Function Tests: No results for input(s): "TSH", "T4TOTAL", "T3FREE", "THYROIDAB" in the last 72 hours.  Invalid input(s): "FREET3"         EKG: sinus @ 57 20/10/41 LAD   Assessment and Plan:  Orthostatic hypotension  Dyspnea upper extremity effort  COPD  Lymphoma >>R-CHOP therapy   Carpal tunnel  Dysphagia   The patient has symptomatic orthostatic hypotension with presyncope.  A recent fall he thinks was mechanical.  Worsening over the last couple of years.  In terms of possible causes, he has a history of R-CHOP with both cyclophosphamide and vincristine associated with orthostasis, although the latter apparently mostly resolved with discontinuation.  He has carpal tunnel although some years ago i.e. relatively young age, which raises at least the specter of amyloid.  His oncologist at [facility] did an IFE and quantitative immunoglobulins.  He has an IgA spike but his magnitude has been stable and somewhat decreasing over the last 3 years.  I will have to defer to someone smarter than I asked whether relevant or not that after him to discuss this with her.  We discussed the physiology of orthostatic intolerance including gravitational fluid shifts and the impact of hypertensive vascular disease on orthostasis and treatment options.  We discussed pharmacological options including midodrine, pyridostigmine, fludrocortisone.  We discussed nonpharmacological options including raising the HOB, isometric contraction upon standing, abdominal binders  thigh sleeves. We  emphasized the importance of recognizing the prodrome and sitting prior to falling, safety in the shower and in the bathroom and the avoidance of dehydration   We will begin with a nonpharmacological approach, deferring pharmacological therapies  A brief literature search suggest  that upper extremity dyspnea can be associated with COPD which he has.  Rehab is recommended.  I have also reached out to our heart failure team to see if they have any further insights.  Coat hanger discomfort which might manifest as upper extremity weakness which could potentially translate his dyspnea can be associated with orthostasis.  His dysphagia is curious with a sense of food getting stuck but the coughing been associated with saliva.  No idea.  He is seeing GI.  I will reach out to see who might be a thoughtful GI person that might help with this      Sherryl Manges

## 2023-09-14 ENCOUNTER — Encounter: Payer: Self-pay | Admitting: Internal Medicine

## 2023-10-19 NOTE — Progress Notes (Unsigned)
Cardiology Office Note:  .   Date:  10/20/2023  ID:  Ryan Roth, DOB [birth year], MRN [id number] PCP: [name], MD  [facility] Providers Cardiologist:  Yvonne Kendall, MD { Click to update primary MD,subspecialty MD or APP then REFRESH:1}    History of Present Illness: .   Ryan Roth is a 67 y.o. 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  He was last seen in our office a month ago by Charlsie Quest, NP, following catheterization in September that showed mild to moderate nonobstructive CAD with normal left and right heart filling pressures and cardiac output/index.  No medication changes or additional testing were pursued.  He saw Dr. Graciela Husbands 2 days later for evaluation of dizziness with possible dysautonomia.  He was counseled on measures to help minimize his orthostatic hypotension.  No further testing was undertaken.  ? GI referral and pulmonary rehab.  No better or no worse.  When eating, feels like food doesn't pass.  If goes on long, has to stop.  If gets to point of coughing, vomits up clear phlegm.  Has had multiple endoscopies - has had strictures stretched a couple of times.  No improvement with esophageal dilation.  Doesn't exercise regularly.  Occ checks BP at home.  Usually similar to today's readings.  Nocturia, not much different.  Wonders if having absorption issues.  Only abd surgiers splenectomy and appendectomy.  ROS: See HPI  Studies Reviewed: Marland Kitchen        R/LHC (08/26/2023): Mild to moderate, nonobstructive coronary artery disease, including mild luminal irregularities throughout the LAD, 30% stenosis of large OM1 branch, and sequential 40-50% proximal and 50-60% distal RCA lesions. Suspect patent stent in the proximal LAD (stent struts not well-visualized). Normal left heart, right heart, and pulmonary artery  pressures. Normal Fick cardiac output/index.  Event monitor (07/29/2023): Predominantly sinus rhythm with rare PACs and PVCs.  No significant arrhythmia identified.  TTE (07/22/2023):  1. Left ventricular ejection fraction, by estimation, is 45 to 50%. The  left ventricle has mildly decreased function. The left ventricle  demonstrates global hypokinesis. The left ventricular internal cavity size  was mildly dilated. Left ventricular  diastolic parameters are consistent with Grade I diastolic dysfunction  (impaired relaxation).   2. Right ventricular systolic function is normal. The right ventricular  size is normal. Tricuspid regurgitation signal is inadequate for assessing  PA pressure.   3. The mitral valve is normal in structure. Mild mitral valve  regurgitation. No evidence of mitral stenosis.   4. The aortic valve is tricuspid. Aortic valve regurgitation is not  visualized. No aortic stenosis is present.   5. There is borderline dilatation of the aortic root, measuring 39 mm.   6. The inferior vena cava is normal in size with greater than 50%  respiratory variability, suggesting right atrial pressure of 3 mmHg.   Risk Assessment/Calculations:   {Does this patient have ATRIAL FIBRILLATION?:[id number]} The patient's 1st BP is elevated (>139/89)*** Repeat BP and {Click to enter a 2nd BP Refresh Note  :1}       Physical Exam:   VS:  BP (!) 140/76 (BP Location: Left Arm, Patient Position: Sitting, Cuff Size: Normal)   Pulse 65   Ht 6\' 7"  (2.007 m)   Wt 243 lb 1.6 oz (110.3 kg)   SpO2 97%   BMI  27.39 kg/m    Wt Readings from Last 3 Encounters:  10/20/23 243 lb 1.6 oz (110.3 kg)  09/11/23 242 lb 3.2 oz (109.9 kg)  09/09/23 243 lb 12.8 oz (110.6 kg)    General:  NAD. Neck: No JVD or HJR. Lungs: Clear to auscultation bilaterally without wheezes or crackles. Heart: Regular rate and rhythm without murmurs, rubs, or gallops. Abdomen: Soft, nontender, nondistended. Extremities: No  lower extremity edema.  ASSESSMENT AND PLAN: .    ***    {Are you ordering a CV Procedure (e.g. stress test, cath, DCCV, TEE, etc)?   Press F2        :[id number]}  Dispo: ***  Signed, Yvonne Kendall, MD

## 2023-10-20 ENCOUNTER — Ambulatory Visit: Payer: Medicare Other | Attending: Internal Medicine | Admitting: Internal Medicine

## 2023-10-20 ENCOUNTER — Encounter: Payer: Self-pay | Admitting: Internal Medicine

## 2023-10-20 VITALS — BP 140/76 | HR 65 | Ht 79.0 in | Wt 243.1 lb

## 2023-10-20 DIAGNOSIS — I10 Essential (primary) hypertension: Secondary | ICD-10-CM

## 2023-10-20 DIAGNOSIS — R42 Dizziness and giddiness: Secondary | ICD-10-CM | POA: Diagnosis not present

## 2023-10-20 DIAGNOSIS — I2510 Atherosclerotic heart disease of native coronary artery without angina pectoris: Secondary | ICD-10-CM | POA: Diagnosis not present

## 2023-10-20 DIAGNOSIS — I5022 Chronic systolic (congestive) heart failure: Secondary | ICD-10-CM | POA: Diagnosis not present

## 2023-10-20 DIAGNOSIS — I429 Cardiomyopathy, unspecified: Secondary | ICD-10-CM

## 2023-10-20 DIAGNOSIS — R1310 Dysphagia, unspecified: Secondary | ICD-10-CM

## 2023-10-20 DIAGNOSIS — I428 Other cardiomyopathies: Secondary | ICD-10-CM

## 2023-10-20 NOTE — Patient Instructions (Signed)
Medication Instructions:  Your physician recommends that you continue on your current medications as directed. Please refer to the Current Medication list given to you today.   *If you need a refill on your cardiac medications before your next appointment, please call your pharmacy*   Lab Work: No labs ordered today .   Testing/Procedures: Your physician has requested that you have a cardiac MRI. Cardiac MRI uses a computer to create images of your heart as its beating, producing both still and moving pictures of your heart and major blood vessels. For further information please visit [email or web address]. Please follow the instruction sheet given to you today for more information.  [facility]  Follow-Up: At [facility], you and your health needs are our priority.  As part of our continuing mission to provide you with exceptional heart care, we have created designated Provider Care Teams.  These Care Teams include your primary Cardiologist (physician) and Advanced Practice Providers (APPs -  Physician Assistants and Nurse Practitioners) who all work together to provide you with the care you need, when you need it.  We recommend signing up for the patient portal called "MyChart".  Sign up information is provided on this After Visit Summary.  MyChart is used to connect with patients for Virtual Visits (Telemedicine).  Patients are able to view lab/test results, encounter notes, upcoming appointments, etc.  Non-urgent messages can be sent to your provider as well.   To learn more about what you can do with MyChart, go to [email or web address].    Your next appointment:   1 month(s)  Provider:   You may see Yvonne Kendall, MD or one of the following Advanced Practice Providers on your designated Care Team:   Nicolasa Ducking, NP Eula Listen, PA-C Cadence Fransico Michael, PA-C Charlsie Quest, NP Carlos Levering, NP

## 2023-10-21 ENCOUNTER — Encounter: Payer: Self-pay | Admitting: Internal Medicine

## 2023-10-21 DIAGNOSIS — R42 Dizziness and giddiness: Secondary | ICD-10-CM | POA: Insufficient documentation

## 2023-10-21 DIAGNOSIS — I5022 Chronic systolic (congestive) heart failure: Secondary | ICD-10-CM | POA: Insufficient documentation

## 2023-10-23 ENCOUNTER — Ambulatory Visit: Payer: Medicare Other | Admitting: Internal Medicine

## 2023-11-18 ENCOUNTER — Telehealth: Payer: Self-pay | Admitting: Internal Medicine

## 2023-11-18 DIAGNOSIS — R0602 Shortness of breath: Secondary | ICD-10-CM

## 2023-11-18 NOTE — Telephone Encounter (Signed)
Secure chat messages received from Dr. Graciela Husbands Dr. Leone Payor:  Dr. Graciela Husbands: carl good morning question for you-- wondering who would be thoughtful, this guyhas some dysphagia symptoms that he saw [facility] for 5/24 (Care [facility]) but no EGD and still with symptoms thanks   Dr. Leone Payor: I suggest he give Dr. Caryl Never another chance and explain that he is more symptomatic - I read the note - if recurrent symptoms then maybe would go ahead and do an EGD. That will hopefully be faster than waiting to see [place].   Dr. Graciela Husbands:  thx   Dr. Graciela Husbands:  also can we refer him to pulm for eval for Upper ExtremityEffort induced dyspnea thanks

## 2023-11-18 NOTE — Telephone Encounter (Signed)
Dr. Graciela Husbands- trying to follow along.  Is the patient aware of this discussion/ the need for the referral.   Thanks!

## 2023-11-24 ENCOUNTER — Ambulatory Visit: Payer: Medicare Other | Admitting: Cardiology

## 2023-12-05 NOTE — Telephone Encounter (Signed)
H  good morning This was supposed to be communicated back to the patient Thanks SK

## 2023-12-08 NOTE — Telephone Encounter (Signed)
Spoke with patient and reviewed referral ordered to see pulmonary. Provided him with their number and where they are located. He verbalized understanding. He also wanted me to pass onto Dr. Graciela Husbands that his symptoms have improved.

## 2023-12-12 ENCOUNTER — Encounter: Payer: Self-pay | Admitting: Pulmonary Disease

## 2023-12-16 DIAGNOSIS — I951 Orthostatic hypotension: Secondary | ICD-10-CM | POA: Insufficient documentation

## 2023-12-18 ENCOUNTER — Telehealth: Payer: Self-pay | Admitting: Internal Medicine

## 2023-12-18 ENCOUNTER — Encounter: Payer: Self-pay | Admitting: Internal Medicine

## 2023-12-18 ENCOUNTER — Ambulatory Visit: Payer: Medicare Other | Admitting: Internal Medicine

## 2023-12-18 VITALS — BP 135/87 | HR 87 | Ht 79.0 in | Wt 247.6 lb

## 2023-12-18 DIAGNOSIS — I951 Orthostatic hypotension: Secondary | ICD-10-CM

## 2023-12-18 NOTE — Progress Notes (Signed)
 Not seen

## 2023-12-18 NOTE — Telephone Encounter (Signed)
 LVM to schedule appt

## 2023-12-22 ENCOUNTER — Encounter (HOSPITAL_COMMUNITY): Payer: Self-pay

## 2023-12-24 ENCOUNTER — Other Ambulatory Visit: Payer: Self-pay | Admitting: Internal Medicine

## 2023-12-24 ENCOUNTER — Ambulatory Visit
Admission: RE | Admit: 2023-12-24 | Discharge: 2023-12-24 | Disposition: A | Discharge status: Home / Self Care | Payer: Medicare Other | Source: Ambulatory Visit | Attending: Internal Medicine | Admitting: Internal Medicine

## 2023-12-24 DIAGNOSIS — I428 Other cardiomyopathies: Secondary | ICD-10-CM

## 2023-12-24 MED ORDER — GADOBUTROL 1 MMOL/ML IV SOLN
14.0000 mL | Freq: Once | INTRAVENOUS | Status: AC | PRN
  Administered 2023-12-24: 14 mL via INTRAVENOUS

## 2023-12-26 ENCOUNTER — Encounter: Payer: Self-pay | Admitting: Cardiology

## 2023-12-26 ENCOUNTER — Ambulatory Visit: Payer: Medicare Other | Attending: Cardiology | Admitting: Cardiology

## 2023-12-26 VITALS — BP 132/76 | HR 97 | Ht 79.0 in | Wt 244.2 lb

## 2023-12-26 DIAGNOSIS — I5020 Unspecified systolic (congestive) heart failure: Secondary | ICD-10-CM

## 2023-12-26 DIAGNOSIS — I951 Orthostatic hypotension: Secondary | ICD-10-CM

## 2023-12-26 DIAGNOSIS — R0602 Shortness of breath: Secondary | ICD-10-CM

## 2023-12-26 DIAGNOSIS — I2510 Atherosclerotic heart disease of native coronary artery without angina pectoris: Secondary | ICD-10-CM | POA: Diagnosis not present

## 2023-12-26 DIAGNOSIS — Z8572 Personal history of non-Hodgkin lymphomas: Secondary | ICD-10-CM

## 2023-12-26 DIAGNOSIS — R002 Palpitations: Secondary | ICD-10-CM

## 2023-12-26 DIAGNOSIS — I5022 Chronic systolic (congestive) heart failure: Secondary | ICD-10-CM

## 2023-12-26 DIAGNOSIS — I428 Other cardiomyopathies: Secondary | ICD-10-CM | POA: Diagnosis not present

## 2023-12-26 NOTE — Progress Notes (Signed)
Cardiology Office Note:  .   Date:  12/26/2023  ID:  Ryan Roth, DOB March 13, 1956, MRN [id number] PCP: Ryan Gauss, MD  [facility] Providers Cardiologist:  Ryan Kendall, MD    History of Present Illness: .   Ryan Roth is a 68 y.o. male with a past medical history of coronary artery disease status post PCI in 2012 at [facility], hypertension, hyperlipidemia, diffuse large B-cell lymphoma, ITP, BPH, who presents today for follow-up.   Previously was followed by [facility] cardiology with previous echocardiogram revealing a dilated RV without evidence of intracardiac shunting.  He was referred to pulmonary and also underwent a right heart catheterization which showed normal left heart filling pressures, mildly elevated right heart filling pressures, borderline low cardiac output/index.  He had his initial MI in 2012 reports he underwent subsequent heart catheterizations at [facility] with an additional catheterization completed in 2021 which did not show any obstructive coronary disease.   He was last seen in clinic 08/20/2023 with concerns over fatigue, shortness of breath, tired muscles, GI issues, and tachycardia.  He had also endorsed lightheadedness/dizziness, palpitations, increased nausea with vomiting.  States he has been compliant with his medication regimen and follow-up with GI numerous times and had several studies completed that were unrevealing.  He was scheduled for a right and left heart catheterization on 08/26/2023.  He was also encouraged to continue with maintaining adequate hydration and possible compression sleeves and referred to EP for possible dysautonomia.    He was last seen in clinic by Dr. Okey Dupre on 10/20/2023 to see multiple gastroenterologist in the past and undergone multiple endoscopic as well as esophageal dilatations.  Despite this intervention stated does not prevent in the sensation of food feeling to pass from his stomach into his bowels.  He wonders if  some of his problems could be related to elevated IgG levels noted by his oncologist.  He was scheduled for cardiac MRI based on the results may need to consider referral to advanced heart failure and/or cardiopulmonary stress test to determine if underlying pulmonary process or deconditioning is driving his symptoms.  He was continued on secondary prevention of coronary artery disease with aspirin and rosuvastatin.  He returns to clinic today   ROS: 10 point review of system has been reviewed and considered negative with exception of what is been listed in HPI  Studies Reviewed: .       cMRI 12/24/2023 IMPRESSION: 1.  Normal LV size, mildly reduced LV systolic function.  LVEF 47%.   2.  No LGE or scar.   3.  Normal ECV with no evidence for myocardial infiltration.   4.  Normal RV function.   5.  No significant valvular abnormalities.   Heart Monitor 08/20/2023   The patient was monitored for 6 days, 17 hours.   The predominant rhythm was sinus with an average rate of 83 bpm (range 50-143 bpm).   There were rare PAC's and PVC's.   No sustained arrhythmia or prolonged pause was identified.   Patient triggered events correspond to sinus rhythm, PAC's, and PVC's.   Predominantly sinus rhythm with rare PAC's and PVC's.  No significant arrhythmia identified.   R/LHC 08/26/23 Conclusions: Mild to moderate, nonobstructive coronary artery disease, including mild luminal irregularities throughout the LAD, 30% stenosis of large OM1 branch, and sequential 40-50% proximal and 50-60% distal RCA lesions. Suspect patent stent in the proximal LAD (stent struts not well-visualized). Normal left heart, right heart, and pulmonary artery pressures.  Normal Fick cardiac output/index.   Recommendations: Continue medical therapy and risk factor modification to prevent progression of CAD. Escalate goal-directed medical therapy for mildly reduced LVEF that appears nonischemic in nature. Consider further  de-escalation/discontinuation of doxazosin, given continued concern for orthostatic lightheadedness.   Heart Monitor 08/06/23   The patient wore two separate monitors.  Total monitoring time was 4 days, 21 hours.  1 day, 12 hours were suitable for analysis.   The predominant rhythm was sinus with an average rate of 77-82 bpm (range 55-143 bpm).   There were rare PAC's and PVC's.   A total of 5 supraventricular runs were observed, lasting up to 17 beats with a maximum rate of 143 bpm.   No sustained arrhythmia or prolonged pause occurred.   Patient triggered events correspond to sinus rhythm, PAC's, and PVC's.   Predominantly sinus rhythm with rare PAC's and PVC's as well as a few episodes of PSVT, as detailed above.  Monitoring period was brief despite using two separate monitors.   TTE 07/22/23   1. Left ventricular ejection fraction, by estimation, is 45 to 50%. The  left ventricle has mildly decreased function. The left ventricle  demonstrates global hypokinesis. The left ventricular internal cavity size  was mildly dilated. Left ventricular  diastolic parameters are consistent with Grade I diastolic dysfunction  (impaired relaxation).   2. Right ventricular systolic function is normal. The right ventricular  size is normal. Tricuspid regurgitation signal is inadequate for assessing  PA pressure.   3. The mitral valve is normal in structure. Mild mitral valve  regurgitation. No evidence of mitral stenosis.   4. The aortic valve is tricuspid. Aortic valve regurgitation is not  visualized. No aortic stenosis is present.   5. There is borderline dilatation of the aortic root, measuring 39 mm.   6. The inferior vena cava is normal in size with greater than 50%  respiratory variability, suggesting right atrial pressure of 3 mmHg.    RHC (09/20/2022, [facility]): Mean RA 10, RV 25/9, PA 27/14 (19), PCWP 10.  AO saturation 94%, PA saturation 67%.  Fick CO/CI 5.6,2.2.   Echo bubble study  (07/22/2022, [facility]): Negative bubble study.   Pharmacologic MPI (07/11/2022, [facility]): Low risk study without evidence of ischemia or scar.  LVEF 63%.   TTE (06/21/2022, [facility]): Mildly dilated left ventricle with mild LVH.  LVEF greater than 55% with normal wall motion.  Grade 1 diastolic dysfunction.  Mildly dilated RV with mildly reduced contraction.  No significant valvular abnormalities.   LHC (11/16/2020, [facility]): Diffuse mild to moderate coronary artery disease.  Proximal and mid RCA were not hemodynamically significant (RFR 0.95).   LHC (12/23/2012, [facility]): Widely patent proximal LAD stent.  25% mid and distal LAD disease as well as involving OM1 and mid RCA.  LVEF 55%.   LHC/PCI (04/01/2011, [facility]): LAD 70% proximal and 50% distal lesions, OM1 30% stenosis, and mid/distal RCA with 50% stenoses.  LVEF 55%.  PCI to proximal LAD using Xience 3.5 x 8 mm DES. Risk Assessment/Calculations:             Physical Exam:   VS:  BP 132/76 (BP Location: Left Arm, Patient Position: Sitting, Cuff Size: Normal)   Pulse 97   Ht 6\' 7"  (2.007 m)   Wt 244 lb 3.2 oz (110.8 kg)   SpO2 96%   BMI 27.51 kg/m    Wt Readings from Last 3 Encounters:  12/26/23 244 lb 3.2 oz (110.8 kg)  12/18/23 247  lb 9.6 oz (112.3 kg)  10/20/23 243 lb 1.6 oz (110.3 kg)    GEN: Well nourished, well developed in no acute distress NECK: No JVD; No carotid bruits CARDIAC: RRR, no murmurs, rubs, gallops RESPIRATORY:  Clear to auscultation without rales, wheezing or rhonchi  ABDOMEN: Soft, non-tender, non-distended EXTREMITIES:  No edema; No deformity   ASSESSMENT AND PLAN: .   Coronary artery disease native coronary arteries without angina continues to have shortness of breath fatigue that is slightly improved.  Underwent a left heart catheterization which revealed nonobstructive disease with recommendation for continued medical management.  Recently has undergone a cardiac MRI which revealed no LGE or scar, normal ECV with no  evidence of myocardial infiltration, normal LV function, no significant valvular abnormalities, normal LV size, mildly reduced LV systolic function with an LVEF of 47%.  He is continued on aspirin 81 mg daily rosuvastatin 20 mg at bedtime.  He also has Nitrostat 0.4 mg sublingual that he has not used in quite some time.  HFmrEF/ischemic cardiomyopathy last echocardiogram revealed EF of 45-50%.  He is euvolemic on exam but continues to suffer from NYHA II symptoms.  Recent cardiac MRI revealed LVEF of 47%.  Patient is continued on lisinopril 20 mg daily.  Had previously been on beta-blocker therapy that was discontinued for orthostatic hypotension.  With patient stating that he feels better today than he has in the last 12 years and after discussion of escalating GDMT he is not interested and is difficult to since he was intolerant to prior beta-blocker therapy and with his orthostatic hypotension.  He has been encouraged to continue with his activity and maintaining hydration.  Orthostatic hypotension with associated lightheadedness and dizziness.  He has stopped taking his doxazosin previously.  He has noted some slight improvement previously.  He has noted some slight improvement in symptoms with no changes made to his current medication regimen.    Palpitations occasionally that are not worrisome.  He is no longer on beta-blocker therapy.  Previously had an appointment with EP Dr. Graciela Husbands and stated that he waited for over an hour and he had still not made it to his scheduled appointment time so patient left without being seen.  Has no further interest in following back up with the EP at this time.  History of B-cell lymphoma we continues to have close follow-up with surveillance by his oncology team at [facility].       Dispo: Patient return to clinic to the MD/APP in 4 to 5 months or sooner if needed for evaluation of symptoms.  Signed, Corrissa Martello, NP

## 2023-12-26 NOTE — Patient Instructions (Signed)
Medication Instructions:  No changes at this time.   *If you need a refill on your cardiac medications before your next appointment, please call your pharmacy*   Lab Work: None  If you have labs (blood work) drawn today and your tests are completely normal, you will receive your results only by: MyChart Message (if you have MyChart) OR A paper copy in the mail If you have any lab test that is abnormal or we need to change your treatment, we will call you to review the results.   Testing/Procedures: None    Follow-Up: At [facility], you and your health needs are our priority.  As part of our continuing mission to provide you with exceptional heart care, we have created designated Provider Care Teams.  These Care Teams include your primary Cardiologist (physician) and Advanced Practice Providers (APPs -  Physician Assistants and Nurse Practitioners) who all work together to provide you with the care you need, when you need it.   Your next appointment:   4-5 month(s)  Provider:   Yvonne Kendall, MD

## 2024-06-02 ENCOUNTER — Encounter: Payer: Self-pay | Admitting: Internal Medicine

## 2024-06-02 ENCOUNTER — Ambulatory Visit: Payer: Medicare Other | Attending: Internal Medicine | Admitting: Internal Medicine

## 2024-06-02 VITALS — BP 168/80 | HR 66 | Ht 79.0 in | Wt 266.2 lb

## 2024-06-02 DIAGNOSIS — I2510 Atherosclerotic heart disease of native coronary artery without angina pectoris: Secondary | ICD-10-CM | POA: Diagnosis not present

## 2024-06-02 DIAGNOSIS — E785 Hyperlipidemia, unspecified: Secondary | ICD-10-CM | POA: Diagnosis not present

## 2024-06-02 DIAGNOSIS — G901 Familial dysautonomia [Riley-Day]: Secondary | ICD-10-CM

## 2024-06-02 DIAGNOSIS — Z79899 Other long term (current) drug therapy: Secondary | ICD-10-CM

## 2024-06-02 DIAGNOSIS — I5022 Chronic systolic (congestive) heart failure: Secondary | ICD-10-CM | POA: Diagnosis not present

## 2024-06-02 DIAGNOSIS — I10 Essential (primary) hypertension: Secondary | ICD-10-CM

## 2024-06-02 MED ORDER — HYDROCHLOROTHIAZIDE 12.5 MG PO CAPS
12.5000 mg | ORAL_CAPSULE | Freq: Every day | ORAL | 3 refills | Status: AC
Start: 2024-06-02 — End: 2024-08-31

## 2024-06-02 NOTE — Patient Instructions (Signed)
 Medication Instructions:  Your physician recommends the following medication changes.   START TAKING: Hydrochlorothiazide 12.5 mg by mouth daily   *If you need a refill on your cardiac medications before your next appointment, please call your pharmacy*  Lab Work: Your provider would like for you to have following labs drawn today BMP.    Your provider would like for you to return in 1 month to have the following labs drawn: BMP.   Please go to [facility] [address] ([facility] [address], [place] [address] You do not need an appointment.  They are open from 8 am- 4:30 pm.  Lunch from 1:00 pm- 2:00 pm You will not need to be fasting.   Testing/Procedures: No test ordered today   Follow-Up: At [facility], you and your health needs are our priority.  As part of our continuing mission to provide you with exceptional heart care, our providers are all part of one team.  This team includes your primary Cardiologist (physician) and Advanced Practice Providers or APPs (Physician Assistants and Nurse Practitioners) who all work together to provide you with the care you need, when you need it.  Your next appointment:   1 month(s)  Provider:   You may see Lonni Hanson, MD or one of the following Advanced Practice Providers on your designated Care Team:   Lonni Meager, NP Lesley Maffucci, PA-C Bernardino Bring, PA-C Cadence [place], PA-C Tylene Lunch, NP Barnie Hila, NP

## 2024-06-02 NOTE — Progress Notes (Signed)
 Cardiology Office Note:  .   Date:  06/02/2024  ID:  Ryan Roth, DOB 1956/05/09, MRN [id number] PCP: Lemon Lamar Flavors, MD   [facility] Providers Cardiologist:  Lonni Hanson, MD     History of Present Illness: .   Ryan Roth is a 68 y.o. 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  He was last seen in our office in January by Ryan lunch, NP, at which time he noted continued fatigue and dyspnea though slightly improved from prior visit.  He also noted persistent orthostatic lightheadedness that had gotten a little better with discontinuation of doxazosin .  He subsequently underwent cardiac MRI that showed LVEF of 47% and normal RV function.  No late gadolinium enhancement or scar was identified.  Today, Ryan Roth reports that he feels a little better than at prior visits.  He is having less fatigue and lightheadedness though he still has some exertional dyspnea.  He is back on doxazosin  because his blood pressure was suboptimally controlled in the winter.  Since resuming this, he has noted some mild leg swelling.  This was initially attributed to tendinitis by his PCP, though it has not improved with NSAIDs.  He is also concerned about some back pain and nerve compression; he is scheduled for several MRIs this weekend for further evaluation.  He denies chest pain and palpitations.  ROS: See HPI  Studies Reviewed: SABRA   EKG Interpretation Date/Time:  Wednesday June 02 2024 11:09:13 EDT Ventricular Rate:  66 PR Interval:  200 QRS Duration:  94 QT Interval:  382 QTC Calculation: 400 R Axis:   -48  Text Interpretation: Sinus rhythm with Premature atrial complexes Left axis deviation Abnormal ECG When compared with ECG of 11-Sep-2023 10:42, Premature atrial complexes are now Present Confirmed by Aalaiyah Yassin, Lonni  [phone]) on 06/02/2024 1:26:21 PM    Cardiac MRI (12/24/2023): Normal LV size with LVEF 47%.  No late gadolinium enhancement or scar.  Normal RV size and function.  No significant valvular abnormalities.  Risk Assessment/Calculations:     HYPERTENSION CONTROL Vitals:   06/02/24 1101 06/02/24 1124  BP: (!) 164/80 (!) 168/80    The patient's blood pressure is elevated above target today.  In order to address the patient's elevated BP: A new medication was prescribed today.          Physical Exam:   VS:  BP (!) 168/80 (BP Location: Left Arm, Cuff Size: Normal)   Pulse 66   Ht 6' 7 (2.007 m)   Wt 266 lb 4 oz (120.8 kg)   SpO2 97%   BMI 29.99 kg/m    Wt Readings from Last 3 Encounters:  06/02/24 266 lb 4 oz (120.8 kg)  12/26/23 244 lb 3.2 oz (110.8 kg)  12/18/23 247 lb 9.6 oz (112.3 kg)    General:  NAD. Neck: No JVD or HJR. Lungs: Clear to auscultation bilaterally without wheezes or crackles. Heart: Regular rate and rhythm with occasional extrasystoles.  No murmurs. Abdomen: Soft, nontender, nondistended. Extremities: 1+ distal calf edema bilaterally.  ASSESSMENT AND PLAN: .    Heart failure with mildly reduced ejection fraction: Ryan Roth has mild lower extremity edema that began after resuming doxazosin .  Question if vasodilatation from the doxazosin  is contributing.  He otherwise does not have any new heart failure symptoms.  We will continue  lisinopril 20 mg daily and add HCTZ for gentle diuresis and improved blood pressure control.  Coronary artery disease and hyperlipidemia: No angina reported, with prior catheterization in 08/2023 showed patent LAD stent and otherwise mild to moderate, nonobstructive CAD.  Continue aspirin  and statin therapy for secondary prevention.  Hypertension: Blood pressure suboptimally controlled today despite resumption of doxazosin .  Continue current regimen of doxazosin  and lisinopril for now.  We will also add HCTZ 12.5 mg daily.  I will  check a BMP today and again in a month when he follows up.  Given cardiomyopathy, rechallenge with a beta-blocker could be considered at next visit as well if blood pressure remains elevated (previously held due to orthostatic lightheadedness).  Dysautonomia: Overall, lightheadedness seems to have improved.  Will need to monitor this closely now that he is back on doxazosin  and with addition of HCTZ today.    Dispo: Return to clinic in 1 month.  BMP should be checked at that time.  Signed, Lonni Hanson, MD

## 2024-06-03 LAB — BASIC METABOLIC PANEL WITH GFR
BUN/Creatinine Ratio: 11 (ref 10–24)
BUN: 14 mg/dL (ref 8–27)
CO2: 21 mmol/L (ref 20–29)
Calcium: 9.2 mg/dL (ref 8.6–10.2)
Chloride: 102 mmol/L (ref 96–106)
Creatinine, Ser: 1.31 mg/dL — ABNORMAL HIGH (ref 0.76–1.27)
Glucose: 78 mg/dL (ref 70–99)
Potassium: 4.6 mmol/L (ref 3.5–5.2)
Sodium: 139 mmol/L (ref 134–144)
eGFR: 59 mL/min/{1.73_m2} — ABNORMAL LOW (ref >59)

## 2024-06-04 ENCOUNTER — Ambulatory Visit: Payer: Self-pay | Admitting: Internal Medicine

## 2024-06-22 LAB — BASIC METABOLIC PANEL WITH GFR
BUN/Creatinine Ratio: 11 (ref 10–24)
BUN: 15 mg/dL (ref 8–27)
CO2: 21 mmol/L (ref 20–29)
Calcium: 9.6 mg/dL (ref 8.6–10.2)
Chloride: 101 mmol/L (ref 96–106)
Creatinine, Ser: 1.38 mg/dL — ABNORMAL HIGH (ref 0.76–1.27)
Glucose: 95 mg/dL (ref 70–99)
Potassium: 4.4 mmol/L (ref 3.5–5.2)
Sodium: 138 mmol/L (ref 134–144)
eGFR: 55 mL/min/1.73 — ABNORMAL LOW (ref >59)

## 2024-07-14 ENCOUNTER — Encounter: Payer: Self-pay | Admitting: Internal Medicine

## 2024-07-14 ENCOUNTER — Ambulatory Visit: Attending: Internal Medicine | Admitting: Internal Medicine

## 2024-07-14 VITALS — BP 134/76 | HR 71 | Ht 79.0 in | Wt 271.0 lb

## 2024-07-14 DIAGNOSIS — I2510 Atherosclerotic heart disease of native coronary artery without angina pectoris: Secondary | ICD-10-CM | POA: Diagnosis not present

## 2024-07-14 DIAGNOSIS — I10 Essential (primary) hypertension: Secondary | ICD-10-CM | POA: Diagnosis not present

## 2024-07-14 DIAGNOSIS — E785 Hyperlipidemia, unspecified: Secondary | ICD-10-CM

## 2024-07-14 DIAGNOSIS — I5022 Chronic systolic (congestive) heart failure: Secondary | ICD-10-CM

## 2024-07-14 NOTE — Progress Notes (Unsigned)
 Cardiology Office Note:  .   Date:  07/15/2024  ID:  Ryan Roth, DOB Oct 06, 1956, MRN [id number] PCP: Lemon Lamar Flavors, MD  [facility] Providers Cardiologist:  Lonni Hanson, MD     History of Present Illness: .   Ryan Roth is a 68 y.o. 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  I last saw him in late June, at which time he reported feeling a little better than at prior visits with less fatigue and lightheadedness, though he continued to note some exertional dyspnea.  He had been restarted on doxazosin  due to suboptimal blood pressure control over the winter.  Since adding this medication, mild lower extremity edema has been present.  He initially thought this could be due to tendinitis though he did not experience any improvement with NSAIDs.  We agreed to add HCTZ for improved blood pressure control, which was moderately elevated at that time, as well as gentle diuresis.  Today, Ryan Roth reports that he is feeling about the same as at our last visit.  He complains primarily of back pain and headaches as well as chronic knee pain.  He still has some shortness of breath, most pronounced when moving his upper extremities or bending over.  He does not have frank dyspnea when walking.  This has been a longstanding issue for him without clear explanation.  He denies chest pain.  He has rare palpitations without associated symptoms.  Lower extremity edema has improved after addition of HCTZ but has not completely resolved.  He notes that lower extremity venous ultrasound has been ordered by the spine center.  ROS: See HPI  Studies Reviewed: SABRA   EKG Interpretation Date/Time:  Wednesday July 14 2024 11:37:43 EDT Ventricular Rate:  71 PR Interval:  202 QRS Duration:  110 QT Interval:  394 QTC Calculation: 428 R  Axis:   -46  Text Interpretation: Normal sinus rhythm Left axis deviation Poor R wave progression Abnormal ECG When compared with ECG of 14-Jul-2024 11:37, No significant change was found Confirmed by Elsey Holts, Lonni [phone]) on 07/15/2024 11:48:07 AM    Risk Assessment/Calculations:             Physical Exam:   VS:  BP 134/76 (BP Location: Left Arm, Patient Position: Sitting, Cuff Size: Normal)   Pulse 71   Ht 6' 7 (2.007 m)   Wt 271 lb (122.9 kg)   SpO2 95%   BMI 30.53 kg/m    Wt Readings from Last 3 Encounters:  07/14/24 271 lb (122.9 kg)  06/02/24 266 lb 4 oz (120.8 kg)  12/26/23 244 lb 3.2 oz (110.8 kg)    General:  NAD. Neck: No JVD or HJR. Lungs: Clear to auscultation bilaterally without wheezes or crackles. Heart: Regular rate and rhythm without murmurs, rubs, or gallops. Abdomen: Soft, nontender, nondistended. Extremities: Trace pretibial edema bilaterally.  ASSESSMENT AND PLAN: .    HFmrEF: Ryan Roth feels about the same as prior visits with shortness of breath manifesting primarily as dyspnea when moving his arms or bending over.  Question of some degree of diastolic dysfunction is contributing to this.  Ryan Roth does not appear significantly volume overloaded and feels like his leg edema has actually improved since we added HCTZ at his last visit.  We have discussed further adjustment of his medications as well  as objective assessment of his volume status with right heart catheterization but have agreed to defer this.  Coronary artery disease and hyperlipidemia: No angina reported.  Sporadic chest pain not clearly associated with exertion is unlikely to reflect worsening ischemic heart disease, particularly since catheterization just under a year ago showed patent LAD stent and otherwise mild-moderate, nonobstructive CAD.  Will continue aspirin  and statin therapy for secondary prevention.  Hypertension: Blood pressure improved today but still in the borderline  elevated range.  We discussed further medication changes but have agreed to defer this for now.  He should continue to minimize his sodium intake.    Dispo: Return to clinic in 6 months.  Signed, Lonni Hanson, MD

## 2024-07-14 NOTE — Patient Instructions (Signed)

## 2024-07-15 ENCOUNTER — Encounter: Payer: Self-pay | Admitting: Internal Medicine

## 2024-07-20 ENCOUNTER — Encounter: Payer: Self-pay | Admitting: Physical Therapy

## 2024-07-20 ENCOUNTER — Ambulatory Visit: Admitting: Physical Therapy

## 2024-07-20 DIAGNOSIS — M79605 Pain in left leg: Secondary | ICD-10-CM | POA: Diagnosis present

## 2024-07-20 DIAGNOSIS — M6281 Muscle weakness (generalized): Secondary | ICD-10-CM | POA: Diagnosis present

## 2024-07-20 DIAGNOSIS — M5459 Other low back pain: Secondary | ICD-10-CM | POA: Insufficient documentation

## 2024-07-20 NOTE — Therapy (Signed)
 OUTPATIENT PHYSICAL THERAPY THORACOLUMBAR EVALUATION   Patient Name: Ryan Roth MRN: [id number] DOB:04-Jan-1956, 68 y.o., male Today's Date: 07/20/2024  END OF SESSION:  PT End of Session - 07/22/24 0842     Visit Number 1    Number of Visits 13    Date for PT Re-Evaluation 08/31/24    Authorization Type [employer] Medicare    PT Start Time 1344    PT Stop Time 1428    PT Time Calculation (min) 44 min    Activity Tolerance Patient tolerated treatment well    Behavior During Therapy WFL for tasks assessed/performed           Past Medical History:  Diagnosis Date   Arthritis    Complication of anesthesia    BP bottomed out during septoplasty   Coronary artery disease    Dental bridge present    lower right   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   Hypertension    Hyperthyroidism 2000   Radioactive Iodine Treatments   Hypothyroidism    Lumbar stenosis    Myocardial infarction (HCC)    2012, 2014   Thyroid disease    Wears hearing aid in both ears    Has, does not wear   Past Surgical History:  Procedure Laterality Date   APPENDECTOMY     BALLOON DILATION N/A 02/21/2020   Procedure: BALLOON DILATION;  Surgeon: Jinny Carmine, MD;  Location: [facility];  Service: Endoscopy;  Laterality: N/A;   CORONARY ANGIOPLASTY WITH STENT PLACEMENT  2012   ESOPHAGOGASTRODUODENOSCOPY (EGD) WITH PROPOFOL  N/A 02/21/2020   Procedure: ESOPHAGOGASTRODUODENOSCOPY (EGD) WITH PROPOFOL ;  Surgeon: Jinny Carmine, MD;  Location: [facility];  Service: Endoscopy;  Laterality: N/A;   RIGHT/LEFT HEART CATH AND CORONARY ANGIOGRAPHY Bilateral 08/26/2023   Procedure: RIGHT/LEFT HEART CATH AND CORONARY ANGIOGRAPHY;  Surgeon: Mady Bruckner, MD;  Location: [facility];  Service: Cardiovascular;  Laterality: Bilateral;   SEPTOPLASTY  09/04/2018   [facility]   SPLENECTOMY  1990   Patient Active Problem List   Diagnosis Date Noted   Orthostatic hypotension 12/16/2023   Chronic  heart failure with mildly reduced ejection fraction (HFmrEF, 41-49%) (HCC) 10/21/2023   Orthostatic lightheadedness 10/21/2023   Nonischemic cardiomyopathy (HCC) 08/26/2023   Shortness of breath 08/26/2023   Chronic fatigue 06/11/2023   Unintentional weight loss 06/11/2023   History of B-cell lymphoma 06/11/2023   Dysphagia    Stricture and stenosis of esophagus    Hypothyroid 01/13/2020   Sensorineural hearing loss (SNHL), bilateral 01/13/2020   History of splenectomy 05/04/2019   Acute recurrent maxillary sinusitis 01/26/2019   Osteoarthritis of left hip 09/14/2018   Cubital tunnel syndrome, bilateral 07/10/2018   Nasal obstruction 06/09/2018   Carpal tunnel syndrome on both sides 03/04/2018   S/P coronary artery stent placement 12/05/2017   History of nonmelanoma skin cancer 04/14/2017   Obesity with body mass index greater than 30 04/14/2017   Atypical nevi 08/27/2016   Tubular adenoma of colon 09/27/2015   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 05/12/2015   Coronary artery disease involving native coronary artery of native heart without angina pectoris 09/01/2014   Hypertrophy of prostate without urinary obstruction and other lower urinary tract symptoms (LUTS) 09/01/2014   Immune thrombocytopenic purpura (HCC) 09/01/2014   Presence of left artificial knee joint 01/04/2014   Chest pain 12/22/2012   Localized osteoarthrosis, lower leg 11/02/2012   Osteoarthritis of left knee 11/02/2012   Essential hypertension 02/28/2012  Hyperlipidemia LDL goal <70 02/28/2012    PCP: Lemon Lamar Flavors, MD  REFERRING PROVIDER: Jacklyn Rosine Lucas JINNY*  REFERRING DIAG: F48.73 (ICD-10-CM) - Other intervertebral disc displacement, lumbar region   RATIONALE FOR EVALUATION AND TREATMENT: Rehabilitation  THERAPY DIAG: Other low back pain  Pain in left leg  Muscle weakness (generalized)  ONSET DATE: Chronic, worsening last couple of years  FOLLOW-UP APPT SCHEDULED WITH  REFERRING PROVIDER: Yes ; f/u this Friday 07/23/24   SUBJECTIVE:                                                                                                                                                                                         SUBJECTIVE STATEMENT:  Pt is a 68 year old male referred for low back pain and LLE radiating symptoms.   PERTINENT HISTORY: Pt is a 68 year old male referred for low back pain and LLE radiating symptoms. His pain has gotten worse over the last couple years. 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 He previously saw Dr. Michiel who discussed his multiple arthritic changes. He reports he occasionally gets pain going down his leg but the majority of his pain in his back. His back pain is worse with bending forward and trying to get back up, reports he is also now getting numbness in his bilateral feet and has noted some swelling in his left leg. He has neuropathy R>L foot and they feel notably cold/numb. Neurosurgeon started pt on Lyrica.   Pt first saw neurosurgery in 2019 with episodes of locked up back and notable difficulty with ROM. Pt reports being more sedentary with COVID and had Sx/chemo related ot lymphoma.   Pt reports bilateral hip pain that can impede walking, sometimes it hurts more on one side versus other. Previous diagnoses of trochanteric bursitis.   Pt reports occasional disturbed sleep largely from lateral hip pain - he usually sleeps on side.   Pt reports bad fall directly onto R ileum this past September; X-ray to rule out fracture. Notable ecchymosis and pain from this.   Pt had episode of severe L leg swelling and edema without explanation.    PAIN:    Pain Intensity: Present: 3-4/10, Best: 2/10, Worst: 10/10 Pain location: Pain in R>L flank/longissimus region; sciatic-type pain L>RLE that is intermittent, constant  soreness in thighs  Pain Quality: aching, constant Radiating: No  Numbness/Tingling: Yes; bilateral feet with known neuropathy Focal Weakness: yes; prolonged difficulty with LE weakness and challenge with prolonged standing  Aggravating factors: bend at waist, sit to stand Relieving factors: Ibuprofen  24-hour pain behavior: None How long can you sit: within 5 minutes, pt feels notable discomfort  History of prior back injury, pain, surgery, or therapy: Yes; known R hip AVN, Hx of 3 TKAs   Imaging: Yes ;  IMPRESSION: 1. Progression of multilevel degenerative findings with increased L4-L5 severe central spinal canal narrowing. Moderate to severe bilateral foraminal narrowing at this level. 2. L2-L3 increased moderate central spinal canal narrowing with severe right and moderate left foraminal narrowing. 3. L5-S1 moderate to severe left lateral recess narrowing secondary to bulky facet osteophytes. 4. T2 hyperintense left lower pole renal lesion seen to better advantage on the current exam, increased in size. Recommend follow-up renal ultrasound for further evaluation if not performed previously. 5. T1 and T2 hypointense right iliac lesion is partially seen and better characterized on contemporaneous MRI pelvis. 6. L4 vertebral body marrow lesion is similar in size, morphology, and signal characteristics in comparison to the prior MRI lumbar spine. 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    Red flags: Negative for bowel/bladder changes, saddle paresthesia, personal history of cancer (clear last 5 years), h/o spinal tumors, h/o compression fx, h/o abdominal aneurysm, abdominal pain, chills/fever, night sweats, nausea, vomiting, unrelenting pain, first onset of insidious LBP <20 y/o  -BPH heightened urgency   PRECAUTIONS: Other: osteoporosis  WEIGHT BEARING RESTRICTIONS: No  FALLS: Has patient fallen in last 6 months? No  Living Environment Lives  with: lives with their spouse Lives in: House/apartment Stairs: 2 steps to enter home Has following equipment at home: None  Prior level of function: Independent  Occupational demands: Technical sales engineer - retired   Presenter, broadcasting: tending to dog, Naval architect; fly fishing  Patient Goals: Less pain, more flexibility    OBJECTIVE:  Patient Surveys  Modified ODI: 19/50 = 38%  Cognition Patient is oriented to person, place, and time.  Recent memory is intact.  Remote memory is intact.  Attention span and concentration are intact.  Expressive speech is intact.  Patient's fund of knowledge is within normal limits for educational level.    Gross Musculoskeletal Assessment Tremor: None Bulk: Normal Tone: Normal No visible step-off along spinal column, no signs of scoliosis  GAIT: Distance walked: 40 ft Assistive device utilized: None Level of assistance: Complete Independence Comments: Pelvic drop L>R, mild LLE antalgic pattern/dec stance time   Posture: Lumbar lordosis: Decreased Iliac crest height: Equal bilaterally Lumbar lateral shift: Negative  AROM AROM (Normal range in degrees) AROM  07/20/24  Lumbar   Flexion (65) 75% (R flank)  Extension (30) 75% (mild pain, not as much as going forward)  Right lateral flexion (25) 75%* (pain R flank)  Left lateral flexion (25) WNL  Right rotation (30) WNL  Left rotation (30) WNL      Hip Right Left  Flexion (125)    Extension (15)    Abduction (40)    Adduction     Internal Rotation (45)    External Rotation (45)        (* = pain; Blank rows = not tested)   LE MMT: MMT (out of 5) Right 07/20/24 Left 07/20/24  Hip flexion 4+* (R hip) 5  Hip extension Next visit Next visit  Hip abduction (seated) 5 5  Hip adduction    Hip internal rotation    Hip external rotation    Knee flexion 5 5  Knee extension 5 5  Ankle dorsiflexion 4 5  Ankle plantarflexion    Ankle inversion  Ankle eversion    (* = pain; Blank rows = not  tested)  Sensation Dec light touch around L medial/lateral malleoli and bilat dorsal foot/toes  Reflexes R/L Knee Jerk (L3/4): 2+/2+  Ankle Jerk (S1/2): Unable to obtain  Hoffman's sign: negative Clonus: negative   Muscle Length Hamstrings: R: Positive L: Positive [place] (quadriceps): R: Positive L: Positive  Palpation Location Right Left         Lumbar paraspinals 1 1  Quadratus Lumborum    Gluteus Maximus 1 1  Gluteus Medius 1 1  Deep hip external rotators 1 1  PSIS 0 0  Fortin's Area (SIJ) 0 0  Greater Trochanter 1 1  (Blank rows = not tested) Graded on 0-4 scale (0 = no pain, 1 = pain, 2 = pain with wincing/grimacing/flinching, 3 = pain with withdrawal, 4 = unwilling to allow palpation)  Passive Accessory Intervertebral Motion Generally, hypomobile throughout mid to lower lumbar spine.  Special Tests Lumbar Radiculopathy and Discogenic: Centralization and Peripheralization (SN 92, -LR 0.12): Not examined Slump (SN 83, -LR 0.32): R: Negative L: Negative SLR (SN 92, -LR 0.29): R: Negative L:  Negative Crossed SLR (SP 90): R: Negative L: Negative  Facet Joint: Extension-Rotation (SN 100, -LR 0.0): R: Not examined L: Not examined  Lumbar Foraminal Stenosis: Lumbar quadrant (SN 70): R: Positive L: Negative  Hip: FABER (SN 81): R: Not examined L: Not examined Hip scour (SN 50): R: Not examined L: Not examined  Piriformis Syndrome: PACE sign: Negative     TODAY'S TREATMENT: DATE: 07/20/24     Therapeutic Exercise - for HEP establishment, discussion on appropriate exercise/activity modification, PT education   Reviewed baseline home exercises and provided handout for [employer] program (see Access Code); tactile cueing and therapist demonstration utilized as needed for carryover of proper technique to HEP.    Patient education on current condition, anatomy involved, prognosis, plan of care. Discussion on activity modification to prevent flare-up of condition,  including temporary avoidance of painful postures/positions.     PATIENT EDUCATION:  Education details: see above for patient education details Person educated: Patient Education method: Explanation, Demonstration, and Handouts Education comprehension: verbalized understanding and returned demonstration   HOME EXERCISE PROGRAM:  Access Code: [email or web address] URL: [email or web address] Date: 07/20/2024 Prepared by: Venetia Endo  Exercises - Supine Lower Trunk Rotation  - 2 x daily - 7 x weekly - 2 sets - 10 reps - 2-3sec hold - Supine Piriformis Stretch with Foot on Ground  - 2 x daily - 7 x weekly - 3 sets - 30sec hold   ASSESSMENT:  CLINICAL IMPRESSION: Patient is a 68 y.o. male who was seen today for physical therapy evaluation and treatment for low back pain with LLE referred symptoms. Pt has negative testing for lumbar radiculopathy (SLR, SLUMP) and positive quadrant test. Pt does have weakness of R ankle dorsiflexors highlighting potential L4-5 weakness. Pt has comorbid peripheral neuropathy affecting bilateral feet with apparent sensory loss in stocking glove distribution. Pt has bilateral greater trochanteric pain with Hx of diagnosis of trochanteric bursitis, though intermittent involvement of either side could reflect referred pain from lumbar spine. We initiated HEP with gentle lower trunk rotations and piriformis/deep gluteal stretching. No neurodynamics indicated. We will investigate response to extension in lying versus flexion next visit. Pt has current deficits in thoracolumbar AROM, quad/HS flexibility, back pain with largely LLE referred pain, L-spine stiffness, and gait changes. Pt will continue to benefit from skilled PT services to address deficits and improve function.  OBJECTIVE IMPAIRMENTS: Abnormal gait, difficulty walking, decreased ROM, decreased strength, hypomobility, impaired flexibility, postural dysfunction, and pain.   ACTIVITY LIMITATIONS:  lifting, bending, sitting, squatting, sleeping, transfers, and bed mobility  PARTICIPATION LIMITATIONS: meal prep, cleaning, laundry, driving, shopping, and community activity  PERSONAL FACTORS: Age, Past/current experiences, Time since onset of injury/illness/exacerbation, and 3+ comorbidities: (Hx of large B-cell lymphoma, BPH, HTN, HLD, CAD and cardiomyopathy)  are also affecting patient's functional outcome.   REHAB POTENTIAL: Good  CLINICAL DECISION MAKING: Evolving/moderate complexity  EVALUATION COMPLEXITY: Moderate   GOALS: Goals reviewed with patient? Yes  SHORT TERM GOALS: Target date: 08/11/2024  Pt will be independent with HEP in order to improve strength and decrease back pain to improve pain-free function at home and work. Baseline: 07/20/24: Baseline HEP initiated, MDT home exercise and addition of hamstrings stretching to be reviewed on visit #2.   Goal status: INITIAL   LONG TERM GOALS: Target date: 09/02/2024  Patient will have full thoracolumbar AROM without reproduction of pain as needed for reaching items on ground, household chores, bending. Baseline: 07/20/24: Motion loss and pain with flexion/extension and R lateral flexion. Goal status: INITIAL  2.  Pt will decrease worst back pain by at least 2 points on the NPRS in order to demonstrate clinically significant reduction in back pain. Baseline: 07/20/24: 10/10 at worst.  Goal status: INITIAL  3.  Pt will decrease mODI score by at least 13 points in order demonstrate clinically significant reduction in back pain/disability.       Baseline: 07/20/24: 19/50 = 38%.    Goal status: INITIAL  4.  Patient will perform sit to stand with no UE support and no reproduction of pain indicative of improved ability to transfer and functional LE strength  Baseline: 07/20/24: Heavy UE support and difficulty with sit to stand.  Goal status: INITIAL   PLAN: PT FREQUENCY: 1-2x/week  PT DURATION: 6 weeks  PLANNED  INTERVENTIONS: Therapeutic exercises, Therapeutic activity, Neuromuscular re-education, Balance training, Gait training, Patient/Family education, Self Care, Joint mobilization, Joint manipulation, Vestibular training, Canalith repositioning, Orthotic/Fit training, DME instructions, Dry Needling, Electrical stimulation, Spinal manipulation, Spinal mobilization, Cryotherapy, Moist heat, Taping, Traction, Ultrasound, Ionotophoresis 4mg /ml Dexamethasone, Manual therapy, and Re-evaluation.  PLAN FOR NEXT SESSION: Test sustained extension in lying or repeated flexion as needed. Check FABER and Scour and hip PROM. Check symptomatic response with generalized lumbar traction/compression. Continue with MDT as indicated and hamstrings stretching. Progress graded movement as tolerated.    Venetia Endo, PT, DPT #[id number]  Venetia ONEIDA Endo, PT 07/22/2024, 8:42 AM

## 2024-07-22 ENCOUNTER — Ambulatory Visit: Admitting: Physical Therapy

## 2024-07-22 ENCOUNTER — Encounter: Payer: Self-pay | Admitting: Physical Therapy

## 2024-07-22 DIAGNOSIS — M79605 Pain in left leg: Secondary | ICD-10-CM

## 2024-07-22 DIAGNOSIS — M5459 Other low back pain: Secondary | ICD-10-CM | POA: Diagnosis not present

## 2024-07-22 DIAGNOSIS — M6281 Muscle weakness (generalized): Secondary | ICD-10-CM

## 2024-07-22 NOTE — Therapy (Unsigned)
 OUTPATIENT PHYSICAL THERAPY TREATMENT   Patient Name: Ryan Roth MRN: [id number] DOB:17-Mar-1956, 68 y.o., male Today's Date: 07/22/2024  END OF SESSION:  PT End of Session - 07/22/24 1346     Visit Number 2    Number of Visits 13    Date for PT Re-Evaluation 08/31/24    Authorization Type [employer] Medicare    PT Start Time 1348    PT Stop Time 1428    PT Time Calculation (min) 40 min    Activity Tolerance Patient tolerated treatment well    Behavior During Therapy WFL for tasks assessed/performed            Past Medical History:  Diagnosis Date   Arthritis    Complication of anesthesia    BP bottomed out during septoplasty   Coronary artery disease    Dental bridge present    lower right   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   Hypertension    Hyperthyroidism 2000   Radioactive Iodine Treatments   Hypothyroidism    Lumbar stenosis    Myocardial infarction (HCC)    2012, 2014   Thyroid disease    Wears hearing aid in both ears    Has, does not wear   Past Surgical History:  Procedure Laterality Date   APPENDECTOMY     BALLOON DILATION N/A 02/21/2020   Procedure: BALLOON DILATION;  Surgeon: Jinny Carmine, MD;  Location: [facility];  Service: Endoscopy;  Laterality: N/A;   CORONARY ANGIOPLASTY WITH STENT PLACEMENT  2012   ESOPHAGOGASTRODUODENOSCOPY (EGD) WITH PROPOFOL  N/A 02/21/2020   Procedure: ESOPHAGOGASTRODUODENOSCOPY (EGD) WITH PROPOFOL ;  Surgeon: Jinny Carmine, MD;  Location: [facility];  Service: Endoscopy;  Laterality: N/A;   RIGHT/LEFT HEART CATH AND CORONARY ANGIOGRAPHY Bilateral 08/26/2023   Procedure: RIGHT/LEFT HEART CATH AND CORONARY ANGIOGRAPHY;  Surgeon: Mady Bruckner, MD;  Location: [facility];  Service: Cardiovascular;  Laterality: Bilateral;   SEPTOPLASTY  09/04/2018   [facility]   SPLENECTOMY  1990   Patient Active Problem List   Diagnosis Date Noted   Orthostatic hypotension 12/16/2023   Chronic heart failure  with mildly reduced ejection fraction (HFmrEF, 41-49%) (HCC) 10/21/2023   Orthostatic lightheadedness 10/21/2023   Nonischemic cardiomyopathy (HCC) 08/26/2023   Shortness of breath 08/26/2023   Chronic fatigue 06/11/2023   Unintentional weight loss 06/11/2023   History of B-cell lymphoma 06/11/2023   Dysphagia    Stricture and stenosis of esophagus    Hypothyroid 01/13/2020   Sensorineural hearing loss (SNHL), bilateral 01/13/2020   History of splenectomy 05/04/2019   Acute recurrent maxillary sinusitis 01/26/2019   Osteoarthritis of left hip 09/14/2018   Cubital tunnel syndrome, bilateral 07/10/2018   Nasal obstruction 06/09/2018   Carpal tunnel syndrome on both sides 03/04/2018   S/P coronary artery stent placement 12/05/2017   History of nonmelanoma skin cancer 04/14/2017   Obesity with body mass index greater than 30 04/14/2017   Atypical nevi 08/27/2016   Tubular adenoma of colon 09/27/2015   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 05/12/2015   Coronary artery disease involving native coronary artery of native heart without angina pectoris 09/01/2014   Hypertrophy of prostate without urinary obstruction and other lower urinary tract symptoms (LUTS) 09/01/2014   Immune thrombocytopenic purpura (HCC) 09/01/2014   Presence of left artificial knee joint 01/04/2014   Chest pain 12/22/2012   Localized osteoarthrosis, lower leg 11/02/2012   Osteoarthritis of left knee 11/02/2012   Essential hypertension 02/28/2012  Hyperlipidemia LDL goal <70 02/28/2012    PCP: Lemon Lamar Flavors, MD  REFERRING PROVIDER: Jacklyn Rosine Lucas JINNY*  REFERRING DIAG: F48.73 (ICD-10-CM) - Other intervertebral disc displacement, lumbar region   RATIONALE FOR EVALUATION AND TREATMENT: Rehabilitation  THERAPY DIAG: Other low back pain  Pain in left leg  Muscle weakness (generalized)  ONSET DATE: Chronic, worsening last couple of years  FOLLOW-UP APPT SCHEDULED WITH REFERRING  PROVIDER: Yes ; f/u this Friday 07/23/24  PERTINENT HISTORY: Pt is a 68 year old male referred for low back pain and LLE radiating symptoms. His pain has gotten worse over the last couple years. 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 He previously saw Dr. Michiel who discussed his multiple arthritic changes. He reports he occasionally gets pain going down his leg but the majority of his pain in his back. His back pain is worse with bending forward and trying to get back up, reports he is also now getting numbness in his bilateral feet and has noted some swelling in his left leg. He has neuropathy R>L foot and they feel notably cold/numb. Neurosurgeon started pt on Lyrica.   Pt first saw neurosurgery in 2019 with episodes of locked up back and notable difficulty with ROM. Pt reports being more sedentary with COVID and had Sx/chemo related ot lymphoma.  Pt reports bilateral hip pain that can impede walking, sometimes it hurts more on one side versus other. Previous diagnoses of trochanteric bursitis.  Pt reports occasional disturbed sleep largely from lateral hip pain - he usually sleeps on side.  Pt reports bad fall directly onto R ileum this past September; X-ray to rule out fracture. Notable ecchymosis and pain from this.  Pt had episode of severe L leg swelling and edema without explanation.    PAIN:    Pain Intensity: Present: 3-4/10, Best: 2/10, Worst: 10/10 Pain location: Pain in R>L flank/longissimus region; sciatic-type pain L>RLE that is intermittent, constant soreness in thighs  Pain Quality: aching, constant Radiating: No  Numbness/Tingling: Yes; bilateral feet with known neuropathy Focal Weakness: yes; prolonged difficulty with LE weakness and challenge with prolonged standing  Aggravating factors: bend at waist, sit to stand Relieving factors: Ibuprofen 24-hour pain  behavior: None How long can you sit: within 5 minutes, pt feels notable discomfort  History of prior back injury, pain, surgery, or therapy: Yes; known R hip AVN, Hx of 3 TKAs   Imaging: Yes ;  IMPRESSION: 1. Progression of multilevel degenerative findings with increased L4-L5 severe central spinal canal narrowing. Moderate to severe bilateral foraminal narrowing at this level. 2. L2-L3 increased moderate central spinal canal narrowing with severe right and moderate left foraminal narrowing. 3. L5-S1 moderate to severe left lateral recess narrowing secondary to bulky facet osteophytes. 4. T2 hyperintense left lower pole renal lesion seen to better advantage on the current exam, increased in size. Recommend follow-up renal ultrasound for further evaluation if not performed previously. 5. T1 and T2 hypointense right iliac lesion is partially seen and better characterized on contemporaneous MRI pelvis. 6. L4 vertebral body marrow lesion is similar in size, morphology, and signal characteristics in comparison to the prior MRI lumbar spine. 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    Red flags: Negative for bowel/bladder changes, saddle paresthesia, personal history of cancer (clear last 5 years), h/o spinal tumors, h/o compression fx, h/o abdominal aneurysm, abdominal pain, chills/fever, night sweats, nausea,  vomiting, unrelenting pain, first onset of insidious LBP <20 y/o  -BPH heightened urgency   PRECAUTIONS: Other: osteoporosis  WEIGHT BEARING RESTRICTIONS: No  FALLS: Has patient fallen in last 6 months? No  Living Environment Lives with: lives with their spouse Lives in: House/apartment Stairs: 2 steps to enter home Has following equipment at home: None  Prior level of function: Independent  Occupational demands: Technical sales engineer - retired   Presenter, broadcasting: tending to dog, Naval architect; fly fishing  Patient Goals: Less pain, more flexibility     OBJECTIVE (baseline initial evaluation measures unless otherwise noted):  Patient Surveys  Modified ODI: 19/50 = 38%  GAIT: Distance walked: 40 ft Assistive device utilized: None Level of assistance: Complete Independence Comments: Pelvic drop L>R, mild LLE antalgic pattern/dec stance time   Posture: Lumbar lordosis: Decreased Iliac crest height: Equal bilaterally Lumbar lateral shift: Negative  AROM AROM (Normal range in degrees) AROM  07/20/24  Lumbar   Flexion (65) 75% (R flank)  Extension (30) 75% (mild pain, not as much as going forward)  Right lateral flexion (25) 75%* (pain R flank)  Left lateral flexion (25) WNL  Right rotation (30) WNL  Left rotation (30) WNL      Hip Right Left  Flexion (125) WNL* WNL  Extension (15)    Abduction (40) 40 40  Adduction     Internal Rotation (45) 20 30  External Rotation (45) 45 45      (* = pain; Blank rows = not tested)   LE MMT: MMT (out of 5) Right 07/20/24 Left 07/20/24  Hip flexion 4+* (R hip) 5  Hip extension (updated 07/22/24) 4+ 4-  Hip abduction (seated) 5 5  Hip adduction    Hip internal rotation    Hip external rotation    Knee flexion 5 5  Knee extension 5 5  Ankle dorsiflexion 4 5  Ankle plantarflexion    Ankle inversion    Ankle eversion    (* = pain; Blank rows = not tested)  Sensation Dec light touch around L medial/lateral malleoli and bilat dorsal foot/toes  Reflexes R/L Knee Jerk (L3/4): 2+/2+  Ankle Jerk (S1/2): Unable to obtain  Hoffman's sign: negative Clonus: negative   Muscle Length Hamstrings: R: Positive L: Positive Ely (quadriceps): R: Positive L: Positive  Palpation Location Right Left         Lumbar paraspinals 1 1  Quadratus Lumborum    Gluteus Maximus 1 1  Gluteus Medius 1 1  Deep hip external rotators 1 1  PSIS 0 0  Fortin's Area (SIJ) 0 0  Greater Trochanter 1 1  (Blank rows = not tested) Graded on 0-4 scale (0 = no pain, 1 = pain, 2 = pain with  wincing/grimacing/flinching, 3 = pain with withdrawal, 4 = unwilling to allow palpation)  Passive Accessory Intervertebral Motion Generally, hypomobile throughout mid to lower lumbar spine.  Special Tests Lumbar Radiculopathy and Discogenic: Centralization and Peripheralization (SN 92, -LR 0.12): Not examined Slump (SN 83, -LR 0.32): R: Negative L: Negative SLR (SN 92, -LR 0.29): R: Negative L:  Negative Crossed SLR (SP 90): R: Negative L: Negative  Facet Joint: Extension-Rotation (SN 100, -LR 0.0): R: Not examined L: Not examined  Lumbar Foraminal Stenosis: Lumbar quadrant (SN 70): R: Positive L: Negative  Hip (updated 07/22/24): FABER (SN 81): R: Positive L: Positive Hip scour (SN 50): R: Positive L: Positive  Piriformis Syndrome: PACE sign: Negative     TODAY'S TREATMENT: DATE: 07/22/2024   SUBJECTIVE STATEMENT:  Patient had f/u scheduled with Dr. Jacklyn tomorrow. Patient reports ongoing constant ache affecting L>R paraspinal region; 4/10 NPRS. Patient reports tolerating initial home exercise relatively well. No major changes since initial evaluation.      Manual Therapy - for symptom modulation, soft tissue sensitivity and mobility, joint mobility, ROM   STM/DTM and IASTM with Hypervolt along bilateral lumbar paraspinals and R>L gluteal musculature x 10 minutes General manual lumbar traction in hooklying with Mulligan belt; therapist at foot of patient; 10 sec on, 10 sec off x 5 minutes for nerve root decompression, pain control    Therapeutic Exercise - for improved soft tissue flexibility and extensibility as needed for ROM, improved strength as needed to improve performance of CKC activities/functional movements  Completed remainder of resting: hip ROM, hip special tests, extension in lying screen, hip extensor strength test  Prone on elbows: no significant effect during, may have eased after Supine lower trunk rotations; 1 x 10 alt R/L Seated hamstrings  stretch; 1 x 30 sec on each side   PATIENT EDUCATION: Discussed current findings per initial eval and additional examination today. Updated HEP and discussed expectations with sustained extension in lying drill (prone on elbows).    PATIENT EDUCATION:  Education details: see above for patient education details Person educated: Patient Education method: Explanation, Demonstration, and Handouts Education comprehension: verbalized understanding and returned demonstration   HOME EXERCISE PROGRAM:  Access Code: [email or web address] URL: [email or web address] Date: 07/22/2024 Prepared by: Venetia Endo  Exercises - Static Prone on Elbows  - 3-4 x daily - 7 x weekly - 1 sets - hold - Supine Lower Trunk Rotation  - 2 x daily - 7 x weekly - 2 sets - 10 reps - 2-3sec hold - Supine Piriformis Stretch with Foot on Ground  - 2 x daily - 7 x weekly - 3 sets - 30sec hold - Seated Hamstring Stretch  - 2 x daily - 7 x weekly - 3 sets - 30sec hold   ASSESSMENT:  CLINICAL IMPRESSION: Pt's subjective history seems to highlight extension direction of preference. We tested this today with pt reporting modestly improved localized back pain with prone on elbows/extension in lying position. We completed testing for bilateral hips with positive FABER and Scour on either side likely associated with R AVN and overlay of OA. His HEP was updated for prone on elbows and hamstrings stretching today. Pt has fair response to general traction in supine position for lower quarter pain.  Pt has current deficits in thoracolumbar AROM, quad/HS flexibility, back pain with largely LLE referred pain, L-spine stiffness, and gait changes. Pt will continue to benefit from skilled PT services to address deficits and improve function.   OBJECTIVE IMPAIRMENTS: Abnormal gait, difficulty walking, decreased ROM, decreased strength, hypomobility, impaired flexibility, postural dysfunction, and pain.   ACTIVITY LIMITATIONS:  lifting, bending, sitting, squatting, sleeping, transfers, and bed mobility  PARTICIPATION LIMITATIONS: meal prep, cleaning, laundry, driving, shopping, and community activity  PERSONAL FACTORS: Age, Past/current experiences, Time since onset of injury/illness/exacerbation, and 3+ comorbidities: (Hx of large B-cell lymphoma, BPH, HTN, HLD, CAD and cardiomyopathy)  are also affecting patient's functional outcome.   REHAB POTENTIAL: Good  CLINICAL DECISION MAKING: Evolving/moderate complexity  EVALUATION COMPLEXITY: Moderate   GOALS: Goals reviewed with patient? Yes  SHORT TERM GOALS: Target date: 08/11/2024  Pt will be independent with HEP in order to improve strength and decrease back pain to improve pain-free function at home and work. Baseline: 07/20/24: Baseline HEP initiated, MDT home exercise and  addition of hamstrings stretching to be reviewed on visit #2.   Goal status: INITIAL   LONG TERM GOALS: Target date: 09/02/2024  Patient will have full thoracolumbar AROM without reproduction of pain as needed for reaching items on ground, household chores, bending. Baseline: 07/20/24: Motion loss and pain with flexion/extension and R lateral flexion. Goal status: INITIAL  2.  Pt will decrease worst back pain by at least 2 points on the NPRS in order to demonstrate clinically significant reduction in back pain. Baseline: 07/20/24: 10/10 at worst.  Goal status: INITIAL  3.  Pt will decrease mODI score by at least 13 points in order demonstrate clinically significant reduction in back pain/disability.       Baseline: 07/20/24: 19/50 = 38%.    Goal status: INITIAL  4.  Patient will perform sit to stand with no UE support and no reproduction of pain indicative of improved ability to transfer and functional LE strength  Baseline: 07/20/24: Heavy UE support and difficulty with sit to stand.  Goal status: INITIAL   PLAN: PT FREQUENCY: 1-2x/week  PT DURATION: 6 weeks  PLANNED  INTERVENTIONS: Therapeutic exercises, Therapeutic activity, Neuromuscular re-education, Balance training, Gait training, Patient/Family education, Self Care, Joint mobilization, Joint manipulation, Vestibular training, Canalith repositioning, Orthotic/Fit training, DME instructions, Dry Needling, Electrical stimulation, Spinal manipulation, Spinal mobilization, Cryotherapy, Moist heat, Taping, Traction, Ultrasound, Ionotophoresis 4mg /ml Dexamethasone, Manual therapy, and Re-evaluation.  PLAN FOR NEXT SESSION: EIL (sustained versus repeated movement, caution for osteoporosis). Continue with MDT as indicated and hamstrings stretching. Progress graded movement as tolerated.    Venetia Endo, PT, DPT #[id number]  Venetia ONEIDA Endo, PT 07/22/2024, 1:48 PM

## 2024-08-03 ENCOUNTER — Encounter: Payer: Self-pay | Admitting: Physical Therapy

## 2024-08-03 ENCOUNTER — Ambulatory Visit: Admitting: Physical Therapy

## 2024-08-03 DIAGNOSIS — M5459 Other low back pain: Secondary | ICD-10-CM

## 2024-08-03 DIAGNOSIS — M6281 Muscle weakness (generalized): Secondary | ICD-10-CM

## 2024-08-03 DIAGNOSIS — M79605 Pain in left leg: Secondary | ICD-10-CM

## 2024-08-03 NOTE — Therapy (Unsigned)
 OUTPATIENT PHYSICAL THERAPY TREATMENT   Patient Name: Ryan Roth MRN: [id number] DOB:1955/12/26, 68 y.o., male Today's Date: 08/03/2024  END OF SESSION:  PT End of Session - 08/03/24 1427     Visit Number 3    Number of Visits 13    Date for PT Re-Evaluation 08/31/24    Authorization Type [employer] Medicare    PT Start Time 1351    PT Stop Time 1431    PT Time Calculation (min) 40 min    Activity Tolerance Patient tolerated treatment well    Behavior During Therapy WFL for tasks assessed/performed           Past Medical History:  Diagnosis Date   Arthritis    Complication of anesthesia    BP bottomed out during septoplasty   Coronary artery disease    Dental bridge present    lower right   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   Hypertension    Hyperthyroidism 2000   Radioactive Iodine Treatments   Hypothyroidism    Lumbar stenosis    Myocardial infarction (HCC)    2012, 2014   Thyroid disease    Wears hearing aid in both ears    Has, does not wear   Past Surgical History:  Procedure Laterality Date   APPENDECTOMY     BALLOON DILATION N/A 02/21/2020   Procedure: BALLOON DILATION;  Surgeon: Jinny Carmine, MD;  Location: [facility];  Service: Endoscopy;  Laterality: N/A;   CORONARY ANGIOPLASTY WITH STENT PLACEMENT  2012   ESOPHAGOGASTRODUODENOSCOPY (EGD) WITH PROPOFOL  N/A 02/21/2020   Procedure: ESOPHAGOGASTRODUODENOSCOPY (EGD) WITH PROPOFOL ;  Surgeon: Jinny Carmine, MD;  Location: [facility];  Service: Endoscopy;  Laterality: N/A;   RIGHT/LEFT HEART CATH AND CORONARY ANGIOGRAPHY Bilateral 08/26/2023   Procedure: RIGHT/LEFT HEART CATH AND CORONARY ANGIOGRAPHY;  Surgeon: Mady Bruckner, MD;  Location: [facility];  Service: Cardiovascular;  Laterality: Bilateral;   SEPTOPLASTY  09/04/2018   [facility]   SPLENECTOMY  1990   Patient Active Problem List   Diagnosis Date Noted   Orthostatic hypotension 12/16/2023   Chronic heart failure  with mildly reduced ejection fraction (HFmrEF, 41-49%) (HCC) 10/21/2023   Orthostatic lightheadedness 10/21/2023   Nonischemic cardiomyopathy (HCC) 08/26/2023   Shortness of breath 08/26/2023   Chronic fatigue 06/11/2023   Unintentional weight loss 06/11/2023   History of B-cell lymphoma 06/11/2023   Dysphagia    Stricture and stenosis of esophagus    Hypothyroid 01/13/2020   Sensorineural hearing loss (SNHL), bilateral 01/13/2020   History of splenectomy 05/04/2019   Acute recurrent maxillary sinusitis 01/26/2019   Osteoarthritis of left hip 09/14/2018   Cubital tunnel syndrome, bilateral 07/10/2018   Nasal obstruction 06/09/2018   Carpal tunnel syndrome on both sides 03/04/2018   S/P coronary artery stent placement 12/05/2017   History of nonmelanoma skin cancer 04/14/2017   Obesity with body mass index greater than 30 04/14/2017   Atypical nevi 08/27/2016   Tubular adenoma of colon 09/27/2015   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 05/12/2015   Coronary artery disease involving native coronary artery of native heart without angina pectoris 09/01/2014   Hypertrophy of prostate without urinary obstruction and other lower urinary tract symptoms (LUTS) 09/01/2014   Immune thrombocytopenic purpura (HCC) 09/01/2014   Presence of left artificial knee joint 01/04/2014   Chest pain 12/22/2012   Localized osteoarthrosis, lower leg 11/02/2012   Osteoarthritis of left knee 11/02/2012   Essential hypertension 02/28/2012   Hyperlipidemia  LDL goal <70 02/28/2012    PCP: Lemon Lamar Flavors, MD  REFERRING PROVIDER: Jacklyn Rosine Lucas JINNY*  REFERRING DIAG: F48.73 (ICD-10-CM) - Other intervertebral disc displacement, lumbar region   RATIONALE FOR EVALUATION AND TREATMENT: Rehabilitation  THERAPY DIAG: Other low back pain  Pain in left leg  Muscle weakness (generalized)  ONSET DATE: Chronic, worsening last couple of years  FOLLOW-UP APPT SCHEDULED WITH REFERRING  PROVIDER: Yes ; f/u this Friday 07/23/24  PERTINENT HISTORY: Pt is a 68 year old male referred for low back pain and LLE radiating symptoms. His pain has gotten worse over the last couple years. 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 He previously saw Dr. Michiel who discussed his multiple arthritic changes. He reports he occasionally gets pain going down his leg but the majority of his pain in his back. His back pain is worse with bending forward and trying to get back up, reports he is also now getting numbness in his bilateral feet and has noted some swelling in his left leg. He has neuropathy R>L foot and they feel notably cold/numb. Neurosurgeon started pt on Lyrica.   Pt first saw neurosurgery in 2019 with episodes of locked up back and notable difficulty with ROM. Pt reports being more sedentary with COVID and had Sx/chemo related to lymphoma.  Pt reports bilateral hip pain that can impede walking, sometimes it hurts more on one side versus other. Previous diagnoses of trochanteric bursitis.  Pt reports occasional disturbed sleep largely from lateral hip pain - he usually sleeps on side.  Pt reports bad fall directly onto R ileum this past September; X-ray to rule out fracture. Notable ecchymosis and pain from this.  Pt had episode of severe L leg swelling and edema without explanation.    PAIN:    Pain Intensity: Present: 3-4/10, Best: 2/10, Worst: 10/10 Pain location: Pain in R>L flank/longissimus region; sciatic-type pain L>RLE that is intermittent, constant soreness in thighs  Pain Quality: aching, constant Radiating: No  Numbness/Tingling: Yes; bilateral feet with known neuropathy Focal Weakness: yes; prolonged difficulty with LE weakness and challenge with prolonged standing  Aggravating factors: bend at waist, sit to stand Relieving factors: Ibuprofen 24-hour pain  behavior: None How long can you sit: within 5 minutes, pt feels notable discomfort  History of prior back injury, pain, surgery, or therapy: Yes; known R hip AVN, Hx of 3 TKAs   Imaging: Yes ;  IMPRESSION: 1. Progression of multilevel degenerative findings with increased L4-L5 severe central spinal canal narrowing. Moderate to severe bilateral foraminal narrowing at this level. 2. L2-L3 increased moderate central spinal canal narrowing with severe right and moderate left foraminal narrowing. 3. L5-S1 moderate to severe left lateral recess narrowing secondary to bulky facet osteophytes. 4. T2 hyperintense left lower pole renal lesion seen to better advantage on the current exam, increased in size. Recommend follow-up renal ultrasound for further evaluation if not performed previously. 5. T1 and T2 hypointense right iliac lesion is partially seen and better characterized on contemporaneous MRI pelvis. 6. L4 vertebral body marrow lesion is similar in size, morphology, and signal characteristics in comparison to the prior MRI lumbar spine. 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    Red flags: Negative for bowel/bladder changes, saddle paresthesia, personal history of cancer (clear last 5 years), h/o spinal tumors, h/o compression fx, h/o abdominal aneurysm, abdominal pain, chills/fever, night sweats, nausea, vomiting,  unrelenting pain, first onset of insidious LBP <20 y/o  -BPH heightened urgency   PRECAUTIONS: Other: osteoporosis  WEIGHT BEARING RESTRICTIONS: No  FALLS: Has patient fallen in last 6 months? No  Living Environment Lives with: lives with their spouse Lives in: House/apartment Stairs: 2 steps to enter home Has following equipment at home: None  Prior level of function: Independent  Occupational demands: Technical sales engineer - retired   Presenter, broadcasting: tending to dog, Naval architect; fly fishing  Patient Goals: Less pain, more flexibility     OBJECTIVE (baseline initial evaluation measures unless otherwise noted):  Patient Surveys  Modified ODI: 19/50 = 38%  GAIT: Distance walked: 40 ft Assistive device utilized: None Level of assistance: Complete Independence Comments: Pelvic drop L>R, mild LLE antalgic pattern/dec stance time   Posture: Lumbar lordosis: Decreased Iliac crest height: Equal bilaterally Lumbar lateral shift: Negative  AROM AROM (Normal range in degrees) AROM  07/20/24  Lumbar   Flexion (65) 75% (R flank)  Extension (30) 75% (mild pain, not as much as going forward)  Right lateral flexion (25) 75%* (pain R flank)  Left lateral flexion (25) WNL  Right rotation (30) WNL  Left rotation (30) WNL      Hip Right Left  Flexion (125) WNL* WNL  Extension (15)    Abduction (40) 40 40  Adduction     Internal Rotation (45) 20 30  External Rotation (45) 45 45      (* = pain; Blank rows = not tested)   LE MMT: MMT (out of 5) Right 07/20/24 Left 07/20/24  Hip flexion 4+* (R hip) 5  Hip extension (updated 07/22/24) 4+ 4-  Hip abduction (seated) 5 5  Hip adduction    Hip internal rotation    Hip external rotation    Knee flexion 5 5  Knee extension 5 5  Ankle dorsiflexion 4 5  Ankle plantarflexion    Ankle inversion    Ankle eversion    (* = pain; Blank rows = not tested)  Sensation Dec light touch around L medial/lateral malleoli and bilat dorsal foot/toes  Reflexes R/L Knee Jerk (L3/4): 2+/2+  Ankle Jerk (S1/2): Unable to obtain  Hoffman's sign: negative Clonus: negative   Muscle Length Hamstrings: R: Positive L: Positive Ely (quadriceps): R: Positive L: Positive  Palpation Location Right Left         Lumbar paraspinals 1 1  Quadratus Lumborum    Gluteus Maximus 1 1  Gluteus Medius 1 1  Deep hip external rotators 1 1  PSIS 0 0  Fortin's Area (SIJ) 0 0  Greater Trochanter 1 1  (Blank rows = not tested) Graded on 0-4 scale (0 = no pain, 1 = pain, 2 = pain with  wincing/grimacing/flinching, 3 = pain with withdrawal, 4 = unwilling to allow palpation)  Passive Accessory Intervertebral Motion Generally, hypomobile throughout mid to lower lumbar spine.  Special Tests Lumbar Radiculopathy and Discogenic: Centralization and Peripheralization (SN 92, -LR 0.12): Not examined Slump (SN 83, -LR 0.32): R: Negative L: Negative SLR (SN 92, -LR 0.29): R: Negative L:  Negative Crossed SLR (SP 90): R: Negative L: Negative  Facet Joint: Extension-Rotation (SN 100, -LR 0.0): R: Not examined L: Not examined  Lumbar Foraminal Stenosis: Lumbar quadrant (SN 70): R: Positive L: Negative  Hip (updated 07/22/24): FABER (SN 81): R: Positive L: Positive Hip scour (SN 50): R: Positive L: Positive  Piriformis Syndrome: PACE sign: Negative     TODAY'S TREATMENT: DATE: 08/03/2024   SUBJECTIVE STATEMENT:  Patient reports more discomfort last few days, 5/10 NPRS at arrival. He is unsure if this is due to recent travels or other variable. Pt reports pain along L paraspinal region of low back. Pt reports no recent N/T or pain into legs/thighs. Pt reports some urinary incontinence recently - he reports having a couple of episodes prior to seeing neurosurgeon; he had more recently. He denies saddle paresthesia. He reports various episodes at night with urge incontinence. Pt denies notable nocturnal pain.   Neuromuscular Re-education - for pain modulation, desensitization   STM along bilateral lumbar paraspinals and R>L gluteal musculature x 10 minutes   Manual Therapy - for symptom modulation, soft tissue sensitivity and mobility, joint mobility, ROM    General manual lumbar traction in supine with Mulligan belt; therapist at foot of patient; 10 sec on, 10 sec off x 5 minutes for nerve root decompression, pain control  -a little better during  -pt reports ongoing 5-6/10 pain after affecting L paraspinal region of low back    Therapeutic Exercise - for improved  soft tissue flexibility and extensibility as needed for ROM, improved strength as needed to improve performance of CKC activities/functional movements  Prone on elbows; x 3 min;   - 6/10 pain/mildly increased during, no change in pain after    Supine lower trunk rotations; 1 x 15 alt R/L Seated hamstrings stretch; 2 x 30 sec on each side   PATIENT EDUCATION: Discussed speaking with MD regarding increasing frequency and urgency with urinary incontinence and discussed S&S of cauda equina syndrome; discussed possible BPH contribution to urinary issues and potential for pelvic health PT to address any pelvic floor dysfunction.    PATIENT EDUCATION:  Education details: see above for patient education details Person educated: Patient Education method: Explanation, Demonstration, and Handouts Education comprehension: verbalized understanding and returned demonstration   HOME EXERCISE PROGRAM:  Access Code: [email or web address] URL: [email or web address] Date: 07/22/2024 Prepared by: Venetia Endo  Exercises - Static Prone on Elbows  - 3-4 x daily - 7 x weekly - 1 sets - hold - Supine Lower Trunk Rotation  - 2 x daily - 7 x weekly - 2 sets - 10 reps - 2-3sec hold - Supine Piriformis Stretch with Foot on Ground  - 2 x daily - 7 x weekly - 3 sets - 30sec hold - Seated Hamstring Stretch  - 2 x daily - 7 x weekly - 3 sets - 30sec hold   ASSESSMENT:  CLINICAL IMPRESSION: Traction yields limited results today after trial for 5 minutes with intermittent manual traction with Mulligan belt. Patient has remaining L paraspinal pain with fair tolerance to STM. Symptoms to appear largely localized at this time with patient's current HEP with prone on elbows being primary exercise. Pt reports episodes of loss of bladder control and difficulty with urge incontinence. He does not have saddle paresthesia, night pain, or constantly severe back symptoms. This was reported to patient's referring  provider on nurse's line for referring office. Hx of BPH can be notable contributor to urinary issues. Pt may need f/u with urology or potential referral to pelvic health specialist for this issue. Pt has current deficits in thoracolumbar AROM, quad/HS flexibility, back pain with largely LLE referred pain, L-spine stiffness, and gait changes. Pt will continue to benefit from skilled PT services to address deficits and improve function.   OBJECTIVE IMPAIRMENTS: Abnormal gait, difficulty walking, decreased ROM, decreased strength, hypomobility, impaired flexibility, postural dysfunction, and pain.   ACTIVITY LIMITATIONS: lifting, bending, sitting,  squatting, sleeping, transfers, and bed mobility  PARTICIPATION LIMITATIONS: meal prep, cleaning, laundry, driving, shopping, and community activity  PERSONAL FACTORS: Age, Past/current experiences, Time since onset of injury/illness/exacerbation, and 3+ comorbidities: (Hx of large B-cell lymphoma, BPH, HTN, HLD, CAD and cardiomyopathy)  are also affecting patient's functional outcome.   REHAB POTENTIAL: Good  CLINICAL DECISION MAKING: Evolving/moderate complexity  EVALUATION COMPLEXITY: Moderate   GOALS: Goals reviewed with patient? Yes  SHORT TERM GOALS: Target date: 08/11/2024  Pt will be independent with HEP in order to improve strength and decrease back pain to improve pain-free function at home and work. Baseline: 07/20/24: Baseline HEP initiated, MDT home exercise and addition of hamstrings stretching to be reviewed on visit #2.   Goal status: INITIAL   LONG TERM GOALS: Target date: 09/02/2024  Patient will have full thoracolumbar AROM without reproduction of pain as needed for reaching items on ground, household chores, bending. Baseline: 07/20/24: Motion loss and pain with flexion/extension and R lateral flexion. Goal status: INITIAL  2.  Pt will decrease worst back pain by at least 2 points on the NPRS in order to demonstrate clinically  significant reduction in back pain. Baseline: 07/20/24: 10/10 at worst.  Goal status: INITIAL  3.  Pt will decrease mODI score by at least 13 points in order demonstrate clinically significant reduction in back pain/disability.       Baseline: 07/20/24: 19/50 = 38%.    Goal status: INITIAL  4.  Patient will perform sit to stand with no UE support and no reproduction of pain indicative of improved ability to transfer and functional LE strength  Baseline: 07/20/24: Heavy UE support and difficulty with sit to stand.  Goal status: INITIAL   PLAN: PT FREQUENCY: 1-2x/week  PT DURATION: 6 weeks  PLANNED INTERVENTIONS: Therapeutic exercises, Therapeutic activity, Neuromuscular re-education, Balance training, Gait training, Patient/Family education, Self Care, Joint mobilization, Joint manipulation, Vestibular training, Canalith repositioning, Orthotic/Fit training, DME instructions, Dry Needling, Electrical stimulation, Spinal manipulation, Spinal mobilization, Cryotherapy, Moist heat, Taping, Traction, Ultrasound, Ionotophoresis 4mg /ml Dexamethasone, Manual therapy, and Re-evaluation.  PLAN FOR NEXT SESSION: EIL (sustained versus repeated movement, caution for osteoporosis). Continue with MDT as indicated and hamstrings stretching. Progress graded movement as tolerated.    Venetia Endo, PT, DPT #[id number]  Venetia ONEIDA Endo, PT 08/03/2024, 2:27 PM

## 2024-08-05 ENCOUNTER — Ambulatory Visit: Admitting: Physical Therapy

## 2024-08-05 ENCOUNTER — Encounter: Payer: Self-pay | Admitting: Physical Therapy

## 2024-08-05 DIAGNOSIS — M5459 Other low back pain: Secondary | ICD-10-CM

## 2024-08-05 DIAGNOSIS — M79605 Pain in left leg: Secondary | ICD-10-CM

## 2024-08-05 DIAGNOSIS — M6281 Muscle weakness (generalized): Secondary | ICD-10-CM

## 2024-08-05 NOTE — Therapy (Signed)
 OUTPATIENT PHYSICAL THERAPY TREATMENT   Patient Name: Ryan Roth MRN: [id number] DOB:[birth year], 68 y.o., male Today's Date: 08/05/2024  END OF SESSION:  PT End of Session - 08/05/24 1347     Visit Number 4    Number of Visits 13    Date for PT Re-Evaluation 08/31/24    Authorization Type [employer] Medicare    PT Start Time 1347    PT Stop Time 1427    PT Time Calculation (min) 40 min    Activity Tolerance Patient tolerated treatment well    Behavior During Therapy WFL for tasks assessed/performed            Past Medical History:  Diagnosis Date   Arthritis    Complication of anesthesia    BP bottomed out during septoplasty   Coronary artery disease    Dental bridge present    lower right   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   Hypertension    Hyperthyroidism 2000   Radioactive Iodine Treatments   Hypothyroidism    Lumbar stenosis    Myocardial infarction (HCC)    2012, 2014   Thyroid disease    Wears hearing aid in both ears    Has, does not wear   Past Surgical History:  Procedure Laterality Date   APPENDECTOMY     BALLOON DILATION N/A 02/21/2020   Procedure: BALLOON DILATION;  Surgeon: Jinny Carmine, MD;  Location: [facility];  Service: Endoscopy;  Laterality: N/A;   CORONARY ANGIOPLASTY WITH STENT PLACEMENT  2012   ESOPHAGOGASTRODUODENOSCOPY (EGD) WITH PROPOFOL  N/A 02/21/2020   Procedure: ESOPHAGOGASTRODUODENOSCOPY (EGD) WITH PROPOFOL ;  Surgeon: Jinny Carmine, MD;  Location: [facility];  Service: Endoscopy;  Laterality: N/A;   RIGHT/LEFT HEART CATH AND CORONARY ANGIOGRAPHY Bilateral 08/26/2023   Procedure: RIGHT/LEFT HEART CATH AND CORONARY ANGIOGRAPHY;  Surgeon: Mady Bruckner, MD;  Location: [facility];  Service: Cardiovascular;  Laterality: Bilateral;   SEPTOPLASTY  09/04/2018   [facility]   SPLENECTOMY  1990   Patient Active Problem List   Diagnosis Date Noted   Orthostatic hypotension 12/16/2023   Chronic heart failure  with mildly reduced ejection fraction (HFmrEF, 41-49%) (HCC) 10/21/2023   Orthostatic lightheadedness 10/21/2023   Nonischemic cardiomyopathy (HCC) 08/26/2023   Shortness of breath 08/26/2023   Chronic fatigue 06/11/2023   Unintentional weight loss 06/11/2023   History of B-cell lymphoma 06/11/2023   Dysphagia    Stricture and stenosis of esophagus    Hypothyroid 01/13/2020   Sensorineural hearing loss (SNHL), bilateral 01/13/2020   History of splenectomy 05/04/2019   Acute recurrent maxillary sinusitis 01/26/2019   Osteoarthritis of left hip 09/14/2018   Cubital tunnel syndrome, bilateral 07/10/2018   Nasal obstruction 06/09/2018   Carpal tunnel syndrome on both sides 03/04/2018   S/P coronary artery stent placement 12/05/2017   History of nonmelanoma skin cancer 04/14/2017   Obesity with body mass index greater than 30 04/14/2017   Atypical nevi 08/27/2016   Tubular adenoma of colon 09/27/2015   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 05/12/2015   Coronary artery disease involving native coronary artery of native heart without angina pectoris 09/01/2014   Hypertrophy of prostate without urinary obstruction and other lower urinary tract symptoms (LUTS) 09/01/2014   Immune thrombocytopenic purpura (HCC) 09/01/2014   Presence of left artificial knee joint 01/04/2014   Chest pain 12/22/2012   Localized osteoarthrosis, lower leg 11/02/2012   Osteoarthritis of left knee 11/02/2012   Essential hypertension 02/28/2012  Hyperlipidemia LDL goal <70 02/28/2012    PCP: Lemon Lamar Flavors, MD  REFERRING PROVIDER: Jacklyn Rosine Lucas JINNY*  REFERRING DIAG: F48.73 (ICD-10-CM) - Other intervertebral disc displacement, lumbar region   RATIONALE FOR EVALUATION AND TREATMENT: Rehabilitation  THERAPY DIAG: Other low back pain  Pain in left leg  Muscle weakness (generalized)  ONSET DATE: Chronic, worsening last couple of years  FOLLOW-UP APPT SCHEDULED WITH REFERRING  PROVIDER: Yes ; f/u this Friday 07/23/24  PERTINENT HISTORY: Pt is a 68 year old male referred for low back pain and LLE radiating symptoms. His pain has gotten worse over the last couple years. 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 He previously saw Dr. Michiel who discussed his multiple arthritic changes. He reports he occasionally gets pain going down his leg but the majority of his pain in his back. His back pain is worse with bending forward and trying to get back up, reports he is also now getting numbness in his bilateral feet and has noted some swelling in his left leg. He has neuropathy R>L foot and they feel notably cold/numb. Neurosurgeon started pt on Lyrica.   Pt first saw neurosurgery in 2019 with episodes of locked up back and notable difficulty with ROM. Pt reports being more sedentary with COVID and had Sx/chemo related to lymphoma.  Pt reports bilateral hip pain that can impede walking, sometimes it hurts more on one side versus other. Previous diagnoses of trochanteric bursitis.  Pt reports occasional disturbed sleep largely from lateral hip pain - he usually sleeps on side.  Pt reports bad fall directly onto R ileum this past September; X-ray to rule out fracture. Notable ecchymosis and pain from this.  Pt had episode of severe L leg swelling and edema without explanation.    PAIN:    Pain Intensity: Present: 3-4/10, Best: 2/10, Worst: 10/10 Pain location: Pain in R>L flank/longissimus region; sciatic-type pain L>RLE that is intermittent, constant soreness in thighs  Pain Quality: aching, constant Radiating: No  Numbness/Tingling: Yes; bilateral feet with known neuropathy Focal Weakness: yes; prolonged difficulty with LE weakness and challenge with prolonged standing  Aggravating factors: bend at waist, sit to stand Relieving factors: Ibuprofen 24-hour pain  behavior: None How long can you sit: within 5 minutes, pt feels notable discomfort  History of prior back injury, pain, surgery, or therapy: Yes; known R hip AVN, Hx of 3 TKAs   Imaging: Yes ;  IMPRESSION: 1. Progression of multilevel degenerative findings with increased L4-L5 severe central spinal canal narrowing. Moderate to severe bilateral foraminal narrowing at this level. 2. L2-L3 increased moderate central spinal canal narrowing with severe right and moderate left foraminal narrowing. 3. L5-S1 moderate to severe left lateral recess narrowing secondary to bulky facet osteophytes. 4. T2 hyperintense left lower pole renal lesion seen to better advantage on the current exam, increased in size. Recommend follow-up renal ultrasound for further evaluation if not performed previously. 5. T1 and T2 hypointense right iliac lesion is partially seen and better characterized on contemporaneous MRI pelvis. 6. L4 vertebral body marrow lesion is similar in size, morphology, and signal characteristics in comparison to the prior MRI lumbar spine. 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    Red flags: Negative for bowel/bladder changes, saddle paresthesia, personal history of cancer (clear last 5 years), h/o spinal tumors, h/o compression fx, h/o abdominal aneurysm, abdominal pain, chills/fever, night sweats, nausea,  vomiting, unrelenting pain, first onset of insidious LBP <20 y/o  -BPH heightened urgency   PRECAUTIONS: Other: osteoporosis  WEIGHT BEARING RESTRICTIONS: No  FALLS: Has patient fallen in last 6 months? No  Living Environment Lives with: lives with their spouse Lives in: House/apartment Stairs: 2 steps to enter home Has following equipment at home: None  Prior level of function: Independent  Occupational demands: Technical sales engineer - retired   Presenter, broadcasting: tending to dog, Naval architect; fly fishing  Patient Goals: Less pain, more flexibility     OBJECTIVE (baseline initial evaluation measures unless otherwise noted):  Patient Surveys  Modified ODI: 19/50 = 38%  GAIT: Distance walked: 40 ft Assistive device utilized: None Level of assistance: Complete Independence Comments: Pelvic drop L>R, mild LLE antalgic pattern/dec stance time   Posture: Lumbar lordosis: Decreased Iliac crest height: Equal bilaterally Lumbar lateral shift: Negative  AROM AROM (Normal range in degrees) AROM  07/20/24  Lumbar   Flexion (65) 75% (R flank)  Extension (30) 75% (mild pain, not as much as going forward)  Right lateral flexion (25) 75%* (pain R flank)  Left lateral flexion (25) WNL  Right rotation (30) WNL  Left rotation (30) WNL      Hip Right Left  Flexion (125) WNL* WNL  Extension (15)    Abduction (40) 40 40  Adduction     Internal Rotation (45) 20 30  External Rotation (45) 45 45      (* = pain; Blank rows = not tested)   LE MMT: MMT (out of 5) Right 07/20/24 Left 07/20/24  Hip flexion 4+* (R hip) 5  Hip extension (updated 07/22/24) 4+ 4-  Hip abduction (seated) 5 5  Hip adduction    Hip internal rotation    Hip external rotation    Knee flexion 5 5  Knee extension 5 5  Ankle dorsiflexion 4 5  Ankle plantarflexion    Ankle inversion    Ankle eversion    (* = pain; Blank rows = not tested)  Sensation Dec light touch around L medial/lateral malleoli and bilat dorsal foot/toes  Reflexes R/L Knee Jerk (L3/4): 2+/2+  Ankle Jerk (S1/2): Unable to obtain  Hoffman's sign: negative Clonus: negative   Muscle Length Hamstrings: R: Positive L: Positive E[place] (quadriceps): R: Positive L: Positive  Palpation Location Right Left         Lumbar paraspinals 1 1  Quadratus Lumborum    Gluteus Maximus 1 1  Gluteus Medius 1 1  Deep hip external rotators 1 1  PSIS 0 0  Fortin's Area (SIJ) 0 0  Greater Trochanter 1 1  (Blank rows = not tested) Graded on 0-4 scale (0 = no pain, 1 = pain, 2 = pain with  wincing/grimacing/flinching, 3 = pain with withdrawal, 4 = unwilling to allow palpation)  Passive Accessory Intervertebral Motion Generally, hypomobile throughout mid to lower lumbar spine.  Special Tests Lumbar Radiculopathy and Discogenic: Centralization and Peripheralization (SN 92, -LR 0.12): Not examined Slump (SN 83, -LR 0.32): R: Negative L: Negative SLR (SN 92, -LR 0.29): R: Negative L:  Negative Crossed SLR (SP 90): R: Negative L: Negative  Facet Joint: Extension-Rotation (SN 100, -LR 0.0): R: Not examined L: Not examined  Lumbar Foraminal Stenosis: Lumbar quadrant (SN 70): R: Positive L: Negative  Hip (updated 07/22/24): FABER (SN 81): R: Positive L: Positive Hip scour (SN 50): R: Positive L: Positive  Piriformis Syndrome: PACE sign: Negative     TODAY'S TREATMENT: DATE: 08/05/2024   SUBJECTIVE STATEMENT:  Patient reports he received call from urology wanting to schedule appointment regarding urinary issues. Patient reports no major updates on his condition at arrival. Patient reports general soreness affecting low back at arrival. Right of center pain in lower lumbar region. Patient reports no leg pain or sciatic-type symptoms - he experiences radiating pain periodically without known aggravating factor. Pt reports no other major incontinence episodes. 4/10 NPRS at arrival.     Neuromuscular Re-education - for pain modulation, desensitization   STM along bilateral lumbar paraspinals and R>L gluteal musculature x 15 minutes   Manual Therapy - soft tissue sensitivity and mobility, joint mobility, ROM   In prone: CPA, gr III; 3 x 30 sec  *not today*     Therapeutic Exercise - for improved soft tissue flexibility and extensibility as needed for ROM, improved strength as needed to improve performance of CKC activities/functional movements  Supine lower trunk rotations; 1 x 15 alt R/L Supine pelvic tilt, focus on anterior tilt; 2 x 10    PATIENT  EDUCATION: Discussed current MDT strategy and expectations with HEP. We discussed availability of DN for persistent symptoms and palpable TrPs in paraspinal mm.   *not today* Seated hamstrings stretch; 2 x 30 sec on each side Prone on elbows; x 3 min;     PATIENT EDUCATION:  Education details: see above for patient education details Person educated: Patient Education method: Explanation, Demonstration, and Handouts Education comprehension: verbalized understanding and returned demonstration   HOME EXERCISE PROGRAM:  Access Code: [email or web address] URL: [email or web address] Date: 07/22/2024 Prepared by: Venetia Endo  Exercises - Static Prone on Elbows  - 3-4 x daily - 7 x weekly - 1 sets - hold - Supine Lower Trunk Rotation  - 2 x daily - 7 x weekly - 2 sets - 10 reps - 2-3sec hold - Supine Piriformis Stretch with Foot on Ground  - 2 x daily - 7 x weekly - 3 sets - 30sec hold - Seated Hamstring Stretch  - 2 x daily - 7 x weekly - 3 sets - 30sec hold   ASSESSMENT:  CLINICAL IMPRESSION: Patient does have frequent episodes of urinary urgency, but no further episodes of incontinence. Patient has ongoing paraspinal pain of varying location (R versus L paraspinal) with remaining pain following manual therapy today. We discussed availability of DN to treat residual paraspinal symptoms. Pt has current deficits in thoracolumbar AROM, quad/HS flexibility, back pain with largely LLE referred pain, L-spine stiffness, and gait changes. Pt will continue to benefit from skilled PT services to address deficits and improve function.   OBJECTIVE IMPAIRMENTS: Abnormal gait, difficulty walking, decreased ROM, decreased strength, hypomobility, impaired flexibility, postural dysfunction, and pain.   ACTIVITY LIMITATIONS: lifting, bending, sitting, squatting, sleeping, transfers, and bed mobility  PARTICIPATION LIMITATIONS: meal prep, cleaning, laundry, driving, shopping, and community  activity  PERSONAL FACTORS: Age, Past/current experiences, Time since onset of injury/illness/exacerbation, and 3+ comorbidities: (Hx of large B-cell lymphoma, BPH, HTN, HLD, CAD and cardiomyopathy)  are also affecting patient's functional outcome.   REHAB POTENTIAL: Good  CLINICAL DECISION MAKING: Evolving/moderate complexity  EVALUATION COMPLEXITY: Moderate   GOALS: Goals reviewed with patient? Yes  SHORT TERM GOALS: Target date: 08/11/2024  Pt will be independent with HEP in order to improve strength and decrease back pain to improve pain-free function at home and work. Baseline: 07/20/24: Baseline HEP initiated, MDT home exercise and addition of hamstrings stretching to be reviewed on visit #2.   Goal status: INITIAL   LONG TERM GOALS: Target  date: 09/02/2024  Patient will have full thoracolumbar AROM without reproduction of pain as needed for reaching items on ground, household chores, bending. Baseline: 07/20/24: Motion loss and pain with flexion/extension and R lateral flexion. Goal status: INITIAL  2.  Pt will decrease worst back pain by at least 2 points on the NPRS in order to demonstrate clinically significant reduction in back pain. Baseline: 07/20/24: 10/10 at worst.  Goal status: INITIAL  3.  Pt will decrease mODI score by at least 13 points in order demonstrate clinically significant reduction in back pain/disability.       Baseline: 07/20/24: 19/50 = 38%.    Goal status: INITIAL  4.  Patient will perform sit to stand with no UE support and no reproduction of pain indicative of improved ability to transfer and functional LE strength  Baseline: 07/20/24: Heavy UE support and difficulty with sit to stand.  Goal status: INITIAL   PLAN: PT FREQUENCY: 1-2x/week  PT DURATION: 6 weeks  PLANNED INTERVENTIONS: Therapeutic exercises, Therapeutic activity, Neuromuscular re-education, Balance training, Gait training, Patient/Family education, Self Care, Joint mobilization,  Joint manipulation, Vestibular training, Canalith repositioning, Orthotic/Fit training, DME instructions, Dry Needling, Electrical stimulation, Spinal manipulation, Spinal mobilization, Cryotherapy, Moist heat, Taping, Traction, Ultrasound, Ionotophoresis 4mg /ml Dexamethasone, Manual therapy, and Re-evaluation.  PLAN FOR NEXT SESSION: EIL (sustained versus repeated movement, caution for osteoporosis). Continue with MDT as indicated and hamstrings stretching. Progress graded movement as tolerated.    Venetia Endo, PT, DPT #[id number]  Venetia ONEIDA Endo, PT 08/05/2024, 1:47 PM

## 2024-08-10 ENCOUNTER — Ambulatory Visit: Admitting: Physical Therapy

## 2024-08-10 DIAGNOSIS — M6281 Muscle weakness (generalized): Secondary | ICD-10-CM | POA: Insufficient documentation

## 2024-08-10 DIAGNOSIS — M79605 Pain in left leg: Secondary | ICD-10-CM | POA: Insufficient documentation

## 2024-08-10 DIAGNOSIS — M5459 Other low back pain: Secondary | ICD-10-CM | POA: Diagnosis present

## 2024-08-10 NOTE — Therapy (Signed)
 OUTPATIENT PHYSICAL THERAPY TREATMENT   Patient Name: Ryan Roth MRN: [id number] DOB:April 01, 1956, 68 y.o., male Today's Date: 08/10/2024  END OF SESSION:  PT End of Session - 08/10/24 1334     Visit Number 5    Number of Visits 13    Date for PT Re-Evaluation 08/31/24    Authorization Type [employer] Medicare    PT Start Time 1300    PT Stop Time 1353    PT Time Calculation (min) 53 min    Activity Tolerance Patient tolerated treatment well    Behavior During Therapy WFL for tasks assessed/performed             Past Medical History:  Diagnosis Date   Arthritis    Complication of anesthesia    BP bottomed out during septoplasty   Coronary artery disease    Dental bridge present    lower right   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   Hypertension    Hyperthyroidism 2000   Radioactive Iodine Treatments   Hypothyroidism    Lumbar stenosis    Myocardial infarction (HCC)    2012, 2014   Thyroid disease    Wears hearing aid in both ears    Has, does not wear   Past Surgical History:  Procedure Laterality Date   APPENDECTOMY     BALLOON DILATION N/A 02/21/2020   Procedure: BALLOON DILATION;  Surgeon: Jinny Carmine, MD;  Location: [facility];  Service: Endoscopy;  Laterality: N/A;   CORONARY ANGIOPLASTY WITH STENT PLACEMENT  2012   ESOPHAGOGASTRODUODENOSCOPY (EGD) WITH PROPOFOL  N/A 02/21/2020   Procedure: ESOPHAGOGASTRODUODENOSCOPY (EGD) WITH PROPOFOL ;  Surgeon: Jinny Carmine, MD;  Location: [facility];  Service: Endoscopy;  Laterality: N/A;   RIGHT/LEFT HEART CATH AND CORONARY ANGIOGRAPHY Bilateral 08/26/2023   Procedure: RIGHT/LEFT HEART CATH AND CORONARY ANGIOGRAPHY;  Surgeon: Mady Bruckner, MD;  Location: [facility];  Service: Cardiovascular;  Laterality: Bilateral;   SEPTOPLASTY  09/04/2018   [facility]   SPLENECTOMY  1990   Patient Active Problem List   Diagnosis Date Noted   Orthostatic hypotension 12/16/2023   Chronic heart failure  with mildly reduced ejection fraction (HFmrEF, 41-49%) (HCC) 10/21/2023   Orthostatic lightheadedness 10/21/2023   Nonischemic cardiomyopathy (HCC) 08/26/2023   Shortness of breath 08/26/2023   Chronic fatigue 06/11/2023   Unintentional weight loss 06/11/2023   History of B-cell lymphoma 06/11/2023   Dysphagia    Stricture and stenosis of esophagus    Hypothyroid 01/13/2020   Sensorineural hearing loss (SNHL), bilateral 01/13/2020   History of splenectomy 05/04/2019   Acute recurrent maxillary sinusitis 01/26/2019   Osteoarthritis of left hip 09/14/2018   Cubital tunnel syndrome, bilateral 07/10/2018   Nasal obstruction 06/09/2018   Carpal tunnel syndrome on both sides 03/04/2018   S/P coronary artery stent placement 12/05/2017   History of nonmelanoma skin cancer 04/14/2017   Obesity with body mass index greater than 30 04/14/2017   Atypical nevi 08/27/2016   Tubular adenoma of colon 09/27/2015   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 05/12/2015   Coronary artery disease involving native coronary artery of native heart without angina pectoris 09/01/2014   Hypertrophy of prostate without urinary obstruction and other lower urinary tract symptoms (LUTS) 09/01/2014   Immune thrombocytopenic purpura (HCC) 09/01/2014   Presence of left artificial knee joint 01/04/2014   Chest pain 12/22/2012   Localized osteoarthrosis, lower leg 11/02/2012   Osteoarthritis of left knee 11/02/2012   Essential hypertension 02/28/2012  Hyperlipidemia LDL goal <70 02/28/2012    PCP: Lemon Lamar Flavors, MD  REFERRING PROVIDER: Jacklyn Rosine Lucas JINNY*  REFERRING DIAG: F48.73 (ICD-10-CM) - Other intervertebral disc displacement, lumbar region   RATIONALE FOR EVALUATION AND TREATMENT: Rehabilitation  THERAPY DIAG: Other low back pain  Pain in left leg  Muscle weakness (generalized)  ONSET DATE: Chronic, worsening last couple of years  FOLLOW-UP APPT SCHEDULED WITH REFERRING  PROVIDER: Yes ; f/u this Friday 07/23/24  PERTINENT HISTORY: Pt is a 68 year old male referred for low back pain and LLE radiating symptoms. His pain has gotten worse over the last couple years. 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 He previously saw Dr. Michiel who discussed his multiple arthritic changes. He reports he occasionally gets pain going down his leg but the majority of his pain in his back. His back pain is worse with bending forward and trying to get back up, reports he is also now getting numbness in his bilateral feet and has noted some swelling in his left leg. He has neuropathy R>L foot and they feel notably cold/numb. Neurosurgeon started pt on Lyrica.   Pt first saw neurosurgery in 2019 with episodes of locked up back and notable difficulty with ROM. Pt reports being more sedentary with COVID and had Sx/chemo related to lymphoma.  Pt reports bilateral hip pain that can impede walking, sometimes it hurts more on one side versus other. Previous diagnoses of trochanteric bursitis.  Pt reports occasional disturbed sleep largely from lateral hip pain - he usually sleeps on side.  Pt reports bad fall directly onto R ileum this past September; X-ray to rule out fracture. Notable ecchymosis and pain from this.  Pt had episode of severe L leg swelling and edema without explanation.    PAIN:    Pain Intensity: Present: 3-4/10, Best: 2/10, Worst: 10/10 Pain location: Pain in R>L flank/longissimus region; sciatic-type pain L>RLE that is intermittent, constant soreness in thighs  Pain Quality: aching, constant Radiating: No  Numbness/Tingling: Yes; bilateral feet with known neuropathy Focal Weakness: yes; prolonged difficulty with LE weakness and challenge with prolonged standing  Aggravating factors: bend at waist, sit to stand Relieving factors: Ibuprofen 24-hour pain  behavior: None How long can you sit: within 5 minutes, pt feels notable discomfort  History of prior back injury, pain, surgery, or therapy: Yes; known R hip AVN, Hx of 3 TKAs   Imaging: Yes ;  IMPRESSION: 1. Progression of multilevel degenerative findings with increased L4-L5 severe central spinal canal narrowing. Moderate to severe bilateral foraminal narrowing at this level. 2. L2-L3 increased moderate central spinal canal narrowing with severe right and moderate left foraminal narrowing. 3. L5-S1 moderate to severe left lateral recess narrowing secondary to bulky facet osteophytes. 4. T2 hyperintense left lower pole renal lesion seen to better advantage on the current exam, increased in size. Recommend follow-up renal ultrasound for further evaluation if not performed previously. 5. T1 and T2 hypointense right iliac lesion is partially seen and better characterized on contemporaneous MRI pelvis. 6. L4 vertebral body marrow lesion is similar in size, morphology, and signal characteristics in comparison to the prior MRI lumbar spine. 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    Red flags: Negative for bowel/bladder changes, saddle paresthesia, personal history of cancer (clear last 5 years), h/o spinal tumors, h/o compression fx, h/o abdominal aneurysm, abdominal pain, chills/fever, night sweats, nausea,  vomiting, unrelenting pain, first onset of insidious LBP <20 y/o  -BPH heightened urgency   PRECAUTIONS: Other: osteoporosis  WEIGHT BEARING RESTRICTIONS: No  FALLS: Has patient fallen in last 6 months? No  Living Environment Lives with: lives with their spouse Lives in: House/apartment Stairs: 2 steps to enter home Has following equipment at home: None  Prior level of function: Independent  Occupational demands: Technical sales engineer - retired   Presenter, broadcasting: tending to dog, Naval architect; fly fishing  Patient Goals: Less pain, more flexibility     OBJECTIVE (baseline initial evaluation measures unless otherwise noted):  Patient Surveys  Modified ODI: 19/50 = 38%  GAIT: Distance walked: 40 ft Assistive device utilized: None Level of assistance: Complete Independence Comments: Pelvic drop L>R, mild LLE antalgic pattern/dec stance time   Posture: Lumbar lordosis: Decreased Iliac crest height: Equal bilaterally Lumbar lateral shift: Negative  AROM AROM (Normal range in degrees) AROM  07/20/24  Lumbar   Flexion (65) 75% (R flank)  Extension (30) 75% (mild pain, not as much as going forward)  Right lateral flexion (25) 75%* (pain R flank)  Left lateral flexion (25) WNL  Right rotation (30) WNL  Left rotation (30) WNL      Hip Right Left  Flexion (125) WNL* WNL  Extension (15)    Abduction (40) 40 40  Adduction     Internal Rotation (45) 20 30  External Rotation (45) 45 45      (* = pain; Blank rows = not tested)   LE MMT: MMT (out of 5) Right 07/20/24 Left 07/20/24  Hip flexion 4+* (R hip) 5  Hip extension (updated 07/22/24) 4+ 4-  Hip abduction (seated) 5 5  Hip adduction    Hip internal rotation    Hip external rotation    Knee flexion 5 5  Knee extension 5 5  Ankle dorsiflexion 4 5  Ankle plantarflexion    Ankle inversion    Ankle eversion    (* = pain; Blank rows = not tested)  Sensation Dec light touch around L medial/lateral malleoli and bilat dorsal foot/toes  Reflexes R/L Knee Jerk (L3/4): 2+/2+  Ankle Jerk (S1/2): Unable to obtain  Hoffman's sign: negative Clonus: negative   Muscle Length Hamstrings: R: Positive L: Positive Ely (quadriceps): R: Positive L: Positive  Palpation Location Right Left         Lumbar paraspinals 1 1  Quadratus Lumborum    Gluteus Maximus 1 1  Gluteus Medius 1 1  Deep hip external rotators 1 1  PSIS 0 0  Fortin's Area (SIJ) 0 0  Greater Trochanter 1 1  (Blank rows = not tested) Graded on 0-4 scale (0 = no pain, 1 = pain, 2 = pain with  wincing/grimacing/flinching, 3 = pain with withdrawal, 4 = unwilling to allow palpation)  Passive Accessory Intervertebral Motion Generally, hypomobile throughout mid to lower lumbar spine.  Special Tests Lumbar Radiculopathy and Discogenic: Centralization and Peripheralization (SN 92, -LR 0.12): Not examined Slump (SN 83, -LR 0.32): R: Negative L: Negative SLR (SN 92, -LR 0.29): R: Negative L:  Negative Crossed SLR (SP 90): R: Negative L: Negative  Facet Joint: Extension-Rotation (SN 100, -LR 0.0): R: Not examined L: Not examined  Lumbar Foraminal Stenosis: Lumbar quadrant (SN 70): R: Positive L: Negative  Hip (updated 07/22/24): FABER (SN 81): R: Positive L: Positive Hip scour (SN 50): R: Positive L: Positive  Piriformis Syndrome: PACE sign: Negative     TODAY'S TREATMENT: DATE: 08/10/2024   SUBJECTIVE STATEMENT:  Patient reports significant L flank pain yesterday. Patient reports walking his dog 1 mi Sunday, and that was first time he had done that in a while. He reports some residual pain along L lower lumbar paraspinal today. Some pain into L glute/proximal thigh yesterday. Patient reports direct impact onto L lateral foot/5th met region when stepping over dog and notable pain with this - that is better today; he is unsure if this affected L flank pain.  Pt is following up with urology/PCP regarding urinary issues. Patient reports 7/10 pain at arrival to PT.    Manual Therapy - soft tissue sensitivity and mobility, joint mobility, ROM   In supine:  General manual lumbar traction in hooklying with Mulligan belt; therapist at foot of patient; 10 sec on, 10 sec off x 5 minutes for nerve root decompression, pain control  In prone: CPA, gr III; 3 x 30 sec   Neuromuscular Re-education - for pain modulation, desensitization   STM/DTM and IASTM with Hypervolt along L>R L4-S1 lumbar paraspinals and L gluteal musculature x 15 minutes    MHP (unbilled) utilized after manual  therapy for analgesic effect and improved soft tissue extensibility; x 5 minutes along lumbosacral spine in prone lying    Therapeutic Exercise - for improved soft tissue flexibility and extensibility as needed for ROM, improved strength as needed to improve performance of CKC activities/functional movements  Supine lower trunk rotations; 1 x 10 alt R/L Supine piriformis stretch attempted, back pain - discontinued  Supine pelvic tilt, focus on anterior tilt; 2 x 10 Bridge; 1 x 10  Seated piriformis stretch; 3 x 30 sec, bilat   PATIENT EDUCATION: Discussed continued HEP and continued sustained extension in lying at home. We discussed potential benefit of DN as adjunct treatment to improve persistent paraspinal/flank pain.    *not today* Seated hamstrings stretch; 2 x 30 sec on each side Prone on elbows; x 3 min;     PATIENT EDUCATION:  Education details: see above for patient education details Person educated: Patient Education method: Explanation, Demonstration, and Handouts Education comprehension: verbalized understanding and returned demonstration   HOME EXERCISE PROGRAM:  Access Code: [email or web address] URL: [email or web address] Date: 07/22/2024 Prepared by: Venetia Endo  Exercises - Static Prone on Elbows  - 3-4 x daily - 7 x weekly - 1 sets - hold - Supine Lower Trunk Rotation  - 2 x daily - 7 x weekly - 2 sets - 10 reps - 2-3sec hold - Supine Piriformis Stretch with Foot on Ground  - 2 x daily - 7 x weekly - 3 sets - 30sec hold - Seated Hamstring Stretch  - 2 x daily - 7 x weekly - 3 sets - 30sec hold   ASSESSMENT:  CLINICAL IMPRESSION: Patient has fair response with use of manual traction in hooklying today. No notable alleviation of flank pain with overpressure in prone (utilized up to gr III mobilization only given known osteoporosis affecting pelvis). Pt has fair response with use of moist heat and STM/IASTM for pain modulation/desensitization of  affected region. We modestly progressed with posterior chain loading on table at low volume without notable increase in pain. We reiterated availability of DN to treat residual paraspinal symptoms. Pt has current deficits in thoracolumbar AROM, quad/HS flexibility, back pain with largely LLE referred pain, L-spine stiffness, and gait changes. Pt will continue to benefit from skilled PT services to address deficits and improve function.   OBJECTIVE IMPAIRMENTS: Abnormal gait, difficulty walking, decreased ROM, decreased strength, hypomobility, impaired flexibility,  postural dysfunction, and pain.   ACTIVITY LIMITATIONS: lifting, bending, sitting, squatting, sleeping, transfers, and bed mobility  PARTICIPATION LIMITATIONS: meal prep, cleaning, laundry, driving, shopping, and community activity  PERSONAL FACTORS: Age, Past/current experiences, Time since onset of injury/illness/exacerbation, and 3+ comorbidities: (Hx of large B-cell lymphoma, BPH, HTN, HLD, CAD and cardiomyopathy)  are also affecting patient's functional outcome.   REHAB POTENTIAL: Good  CLINICAL DECISION MAKING: Evolving/moderate complexity  EVALUATION COMPLEXITY: Moderate   GOALS: Goals reviewed with patient? Yes  SHORT TERM GOALS: Target date: 08/11/2024  Pt will be independent with HEP in order to improve strength and decrease back pain to improve pain-free function at home and work. Baseline: 07/20/24: Baseline HEP initiated, MDT home exercise and addition of hamstrings stretching to be reviewed on visit #2.   Goal status: INITIAL   LONG TERM GOALS: Target date: 09/02/2024  Patient will have full thoracolumbar AROM without reproduction of pain as needed for reaching items on ground, household chores, bending. Baseline: 07/20/24: Motion loss and pain with flexion/extension and R lateral flexion. Goal status: INITIAL  2.  Pt will decrease worst back pain by at least 2 points on the NPRS in order to demonstrate clinically  significant reduction in back pain. Baseline: 07/20/24: 10/10 at worst.  Goal status: INITIAL  3.  Pt will decrease mODI score by at least 13 points in order demonstrate clinically significant reduction in back pain/disability.       Baseline: 07/20/24: 19/50 = 38%.    Goal status: INITIAL  4.  Patient will perform sit to stand with no UE support and no reproduction of pain indicative of improved ability to transfer and functional LE strength  Baseline: 07/20/24: Heavy UE support and difficulty with sit to stand.  Goal status: INITIAL   PLAN: PT FREQUENCY: 1-2x/week  PT DURATION: 6 weeks  PLANNED INTERVENTIONS: Therapeutic exercises, Therapeutic activity, Neuromuscular re-education, Balance training, Gait training, Patient/Family education, Self Care, Joint mobilization, Joint manipulation, Vestibular training, Canalith repositioning, Orthotic/Fit training, DME instructions, Dry Needling, Electrical stimulation, Spinal manipulation, Spinal mobilization, Cryotherapy, Moist heat, Taping, Traction, Ultrasound, Ionotophoresis 4mg /ml Dexamethasone, Manual therapy, and Re-evaluation.  PLAN FOR NEXT SESSION: EIL (sustained versus repeated movement, caution for osteoporosis). Continue with MDT as indicated and hamstrings stretching. Progress graded movement as tolerated.    Venetia Endo, PT, DPT #[id number]  Venetia ONEIDA Endo, PT 08/10/2024, 2:26 PM

## 2024-08-12 ENCOUNTER — Encounter: Payer: Self-pay | Admitting: Physical Therapy

## 2024-08-12 ENCOUNTER — Ambulatory Visit: Admitting: Physical Therapy

## 2024-08-12 DIAGNOSIS — M5459 Other low back pain: Secondary | ICD-10-CM

## 2024-08-12 DIAGNOSIS — M6281 Muscle weakness (generalized): Secondary | ICD-10-CM

## 2024-08-12 DIAGNOSIS — M79605 Pain in left leg: Secondary | ICD-10-CM

## 2024-08-12 NOTE — Therapy (Addendum)
 OUTPATIENT PHYSICAL THERAPY TREATMENT   Patient Name: Ryan Roth MRN: [id number] DOB:[birth year], 68 y.o., male Today's Date: 08/12/2024  END OF SESSION:  PT End of Session - 08/12/24 1513     Visit Number 6    Number of Visits 13    Date for PT Re-Evaluation 08/31/24    Authorization Type [employer] Medicare    PT Start Time 1515    PT Stop Time 1558    PT Time Calculation (min) 43 min    Activity Tolerance Patient tolerated treatment well    Behavior During Therapy WFL for tasks assessed/performed          Past Medical History:  Diagnosis Date   Arthritis    Complication of anesthesia    BP bottomed out during septoplasty   Coronary artery disease    Dental bridge present    lower right   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   Hypertension    Hyperthyroidism 2000   Radioactive Iodine Treatments   Hypothyroidism    Lumbar stenosis    Myocardial infarction (HCC)    2012, 2014   Thyroid disease    Wears hearing aid in both ears    Has, does not wear   Past Surgical History:  Procedure Laterality Date   APPENDECTOMY     BALLOON DILATION N/A 02/21/2020   Procedure: BALLOON DILATION;  Surgeon: Jinny Carmine, MD;  Location: [facility];  Service: Endoscopy;  Laterality: N/A;   CORONARY ANGIOPLASTY WITH STENT PLACEMENT  2012   ESOPHAGOGASTRODUODENOSCOPY (EGD) WITH PROPOFOL  N/A 02/21/2020   Procedure: ESOPHAGOGASTRODUODENOSCOPY (EGD) WITH PROPOFOL ;  Surgeon: Jinny Carmine, MD;  Location: [facility];  Service: Endoscopy;  Laterality: N/A;   RIGHT/LEFT HEART CATH AND CORONARY ANGIOGRAPHY Bilateral 08/26/2023   Procedure: RIGHT/LEFT HEART CATH AND CORONARY ANGIOGRAPHY;  Surgeon: Mady Bruckner, MD;  Location: [facility];  Service: Cardiovascular;  Laterality: Bilateral;   SEPTOPLASTY  09/04/2018   [facility]   SPLENECTOMY  1990   Patient Active Problem List   Diagnosis Date Noted   Orthostatic hypotension 12/16/2023   Chronic heart failure with  mildly reduced ejection fraction (HFmrEF, 41-49%) (HCC) 10/21/2023   Orthostatic lightheadedness 10/21/2023   Nonischemic cardiomyopathy (HCC) 08/26/2023   Shortness of breath 08/26/2023   Chronic fatigue 06/11/2023   Unintentional weight loss 06/11/2023   History of B-cell lymphoma 06/11/2023   Dysphagia    Stricture and stenosis of esophagus    Hypothyroid 01/13/2020   Sensorineural hearing loss (SNHL), bilateral 01/13/2020   History of splenectomy 05/04/2019   Acute recurrent maxillary sinusitis 01/26/2019   Osteoarthritis of left hip 09/14/2018   Cubital tunnel syndrome, bilateral 07/10/2018   Nasal obstruction 06/09/2018   Carpal tunnel syndrome on both sides 03/04/2018   S/P coronary artery stent placement 12/05/2017   History of nonmelanoma skin cancer 04/14/2017   Obesity with body mass index greater than 30 04/14/2017   Atypical nevi 08/27/2016   Tubular adenoma of colon 09/27/2015   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 05/12/2015   Coronary artery disease involving native coronary artery of native heart without angina pectoris 09/01/2014   Hypertrophy of prostate without urinary obstruction and other lower urinary tract symptoms (LUTS) 09/01/2014   Immune thrombocytopenic purpura (HCC) 09/01/2014   Presence of left artificial knee joint 01/04/2014   Chest pain 12/22/2012   Localized osteoarthrosis, lower leg 11/02/2012   Osteoarthritis of left knee 11/02/2012   Essential hypertension 02/28/2012   Hyperlipidemia LDL  goal <70 02/28/2012    PCP: Lemon Lamar Flavors, MD  REFERRING PROVIDER: Jacklyn Rosine Lucas JINNY*  REFERRING DIAG: F48.73 (ICD-10-CM) - Other intervertebral disc displacement, lumbar region   RATIONALE FOR EVALUATION AND TREATMENT: Rehabilitation  THERAPY DIAG: Other low back pain  Pain in left leg  Muscle weakness (generalized)  ONSET DATE: Chronic, worsening last couple of years  FOLLOW-UP APPT SCHEDULED WITH REFERRING PROVIDER: Yes  ; f/u this Friday 07/23/24  PERTINENT HISTORY: Pt is a 68 year old male referred for low back pain and LLE radiating symptoms. His pain has gotten worse over the last couple years. 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 He previously saw Dr. Michiel who discussed his multiple arthritic changes. He reports he occasionally gets pain going down his leg but the majority of his pain in his back. His back pain is worse with bending forward and trying to get back up, reports he is also now getting numbness in his bilateral feet and has noted some swelling in his left leg. He has neuropathy R>L foot and they feel notably cold/numb. Neurosurgeon started pt on Lyrica.   Pt first saw neurosurgery in 2019 with episodes of locked up back and notable difficulty with ROM. Pt reports being more sedentary with COVID and had Sx/chemo related to lymphoma.  Pt reports bilateral hip pain that can impede walking, sometimes it hurts more on one side versus other. Previous diagnoses of trochanteric bursitis.  Pt reports occasional disturbed sleep largely from lateral hip pain - he usually sleeps on side.  Pt reports bad fall directly onto R ileum this past September; X-ray to rule out fracture. Notable ecchymosis and pain from this.  Pt had episode of severe L leg swelling and edema without explanation.    PAIN:    Pain Intensity: Present: 3-4/10, Best: 2/10, Worst: 10/10 Pain location: Pain in R>L flank/longissimus region; sciatic-type pain L>RLE that is intermittent, constant soreness in thighs  Pain Quality: aching, constant Radiating: No  Numbness/Tingling: Yes; bilateral feet with known neuropathy Focal Weakness: yes; prolonged difficulty with LE weakness and challenge with prolonged standing  Aggravating factors: bend at waist, sit to stand Relieving factors: Ibuprofen 24-hour pain behavior: None How  long can you sit: within 5 minutes, pt feels notable discomfort  History of prior back injury, pain, surgery, or therapy: Yes; known R hip AVN, Hx of 3 TKAs   Imaging: Yes ;  IMPRESSION: 1. Progression of multilevel degenerative findings with increased L4-L5 severe central spinal canal narrowing. Moderate to severe bilateral foraminal narrowing at this level. 2. L2-L3 increased moderate central spinal canal narrowing with severe right and moderate left foraminal narrowing. 3. L5-S1 moderate to severe left lateral recess narrowing secondary to bulky facet osteophytes. 4. T2 hyperintense left lower pole renal lesion seen to better advantage on the current exam, increased in size. Recommend follow-up renal ultrasound for further evaluation if not performed previously. 5. T1 and T2 hypointense right iliac lesion is partially seen and better characterized on contemporaneous MRI pelvis. 6. L4 vertebral body marrow lesion is similar in size, morphology, and signal characteristics in comparison to the prior MRI lumbar spine. 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    Red flags: Negative for bowel/bladder changes, saddle paresthesia, personal history of cancer (clear last 5 years), h/o spinal tumors, h/o compression fx, h/o abdominal aneurysm, abdominal pain, chills/fever, night sweats, nausea, vomiting, unrelenting  pain, first onset of insidious LBP <20 y/o  -BPH heightened urgency   PRECAUTIONS: Other: osteoporosis  WEIGHT BEARING RESTRICTIONS: No  FALLS: Has patient fallen in last 6 months? No  Living Environment Lives with: lives with their spouse Lives in: House/apartment Stairs: 2 steps to enter home Has following equipment at home: None  Prior level of function: Independent  Occupational demands: Technical sales engineer - retired   Presenter, broadcasting: tending to dog, Naval architect; fly fishing  Patient Goals: Less pain, more flexibility    OBJECTIVE (baseline initial  evaluation measures unless otherwise noted):  Patient Surveys  Modified ODI: 19/50 = 38%  GAIT: Distance walked: 40 ft Assistive device utilized: None Level of assistance: Complete Independence Comments: Pelvic drop L>R, mild LLE antalgic pattern/dec stance time   Posture: Lumbar lordosis: Decreased Iliac crest height: Equal bilaterally Lumbar lateral shift: Negative  AROM AROM (Normal range in degrees) AROM  07/20/24  Lumbar   Flexion (65) 75% (R flank)  Extension (30) 75% (mild pain, not as much as going forward)  Right lateral flexion (25) 75%* (pain R flank)  Left lateral flexion (25) WNL  Right rotation (30) WNL  Left rotation (30) WNL      Hip Right Left  Flexion (125) WNL* WNL  Extension (15)    Abduction (40) 40 40  Adduction     Internal Rotation (45) 20 30  External Rotation (45) 45 45      (* = pain; Blank rows = not tested)   LE MMT: MMT (out of 5) Right 07/20/24 Left 07/20/24  Hip flexion 4+* (R hip) 5  Hip extension (updated 07/22/24) 4+ 4-  Hip abduction (seated) 5 5  Hip adduction    Hip internal rotation    Hip external rotation    Knee flexion 5 5  Knee extension 5 5  Ankle dorsiflexion 4 5  Ankle plantarflexion    Ankle inversion    Ankle eversion    (* = pain; Blank rows = not tested)  Sensation Dec light touch around L medial/lateral malleoli and bilat dorsal foot/toes  Reflexes R/L Knee Jerk (L3/4): 2+/2+  Ankle Jerk (S1/2): Unable to obtain  Hoffman's sign: negative Clonus: negative   Muscle Length Hamstrings: R: Positive L: Positive Ely (quadriceps): R: Positive L: Positive  Palpation Location Right Left         Lumbar paraspinals 1 1  Quadratus Lumborum    Gluteus Maximus 1 1  Gluteus Medius 1 1  Deep hip external rotators 1 1  PSIS 0 0  Fortin's Area (SIJ) 0 0  Greater Trochanter 1 1  (Blank rows = not tested) Graded on 0-4 scale (0 = no pain, 1 = pain, 2 = pain with wincing/grimacing/flinching, 3 = pain with  withdrawal, 4 = unwilling to allow palpation)  Passive Accessory Intervertebral Motion Generally, hypomobile throughout mid to lower lumbar spine.  Special Tests Lumbar Radiculopathy and Discogenic: Centralization and Peripheralization (SN 92, -LR 0.12): Not examined Slump (SN 83, -LR 0.32): R: Negative L: Negative SLR (SN 92, -LR 0.29): R: Negative L:  Negative Crossed SLR (SP 90): R: Negative L: Negative  Facet Joint: Extension-Rotation (SN 100, -LR 0.0): R: Not examined L: Not examined  Lumbar Foraminal Stenosis: Lumbar quadrant (SN 70): R: Positive L: Negative  Hip (updated 07/22/24): FABER (SN 81): R: Positive L: Positive Hip scour (SN 50): R: Positive L: Positive  Piriformis Syndrome: PACE sign: Negative     TODAY'S TREATMENT: DATE: 08/12/2024   SUBJECTIVE STATEMENT:  Patient reports more central pain at arrival to PT today. Patient reports 3/10 pain at arrival to PT. Patient reports compliance with his HEP. Pt is following up with pain management, and he is awaiting diagnostic nerve block prior to ablation. Pt wishes to hold on dry needling as part of treatment in outpatient PT at this time.    AROM Lumbar flexion 75% * (board pain across back, R ilieum)  Lumbar extension WNL * (mild/pull pain at waist) Lateral flexion: R 75%*, L WNL Thoracolumbar rotation: R WNL, L WNL    Manual Therapy - soft tissue sensitivity and mobility, joint mobility, ROM   In supine:  General manual lumbar traction in hooklying with Mulligan belt; therapist at foot of patient; 10 sec on, 10 sec off x 6 minutes for nerve root decompression, pain control  In prone with pt propped on elbows: CPA, gr III; 3 x 30 sec   Neuromuscular Re-education - for pain modulation, desensitization   *not today* STM/DTM and IASTM with Hypervolt along L>R L4-S1 lumbar paraspinals and L gluteal musculature x 15 minutes     Therapeutic Exercise - for improved soft tissue flexibility and  extensibility as needed for ROM, improved strength as needed to improve performance of CKC activities/functional movements   Prone on elbows; x 3 min;   Supine lower trunk rotations; 1 x 10 alt R/L  Supine pelvic tilt, focus on anterior tilt; 2 x 10  Bridge; 1 x 10  -difficulty with repeated incidences of hamstring cramping  Sit to stand with increased table height to limit closed-chain knee flexion, upright trunk; 2 x 10  PATIENT EDUCATION: Discussed continued HEP and continued sustained extension in lying at home. Reviewed centralization phenomenon and expectations with EIL.    *not today* Seated piriformis stretch; 3 x 30 sec, bilat Supine piriformis stretch attempted, back pain - discontinued Seated hamstrings stretch; 2 x 30 sec on each side    PATIENT EDUCATION:  Education details: see above for patient education details Person educated: Patient Education method: Explanation, Demonstration, and Handouts Education comprehension: verbalized understanding and returned demonstration   HOME EXERCISE PROGRAM:  Access Code: [email or web address] URL: [email or web address] Date: 07/22/2024 Prepared by: Venetia Endo  Exercises - Static Prone on Elbows  - 3-4 x daily - 7 x weekly - 1 sets - hold - Supine Lower Trunk Rotation  - 2 x daily - 7 x weekly - 2 sets - 10 reps - 2-3sec hold - Supine Piriformis Stretch with Foot on Ground  - 2 x daily - 7 x weekly - 3 sets - 30sec hold - Seated Hamstring Stretch  - 2 x daily - 7 x weekly - 3 sets - 30sec hold   ASSESSMENT:  CLINICAL IMPRESSION: Patient fortunately has relatively low NPRS at arrival, but he still has continuous low back pain that is limiting his ADLs. Pt is awaiting further workup with pain management for diagnostic workup and nerve ablation. Pain is largely localized along axial lumbar spine and flank (can alternate to either side). Pt has fair response with hooklying traction and ongoing axial/waistline pain  with sustained extension in lying. Pt will be traveling out of the country next week, and he will need HEP update. Pt has current deficits in thoracolumbar AROM, quad/HS flexibility, back pain with largely LLE referred pain, L-spine stiffness, and gait changes. Pt will continue to benefit from skilled PT services to address deficits and improve function.   OBJECTIVE IMPAIRMENTS: Abnormal gait, difficulty walking, decreased ROM, decreased strength,  hypomobility, impaired flexibility, postural dysfunction, and pain.   ACTIVITY LIMITATIONS: lifting, bending, sitting, squatting, sleeping, transfers, and bed mobility  PARTICIPATION LIMITATIONS: meal prep, cleaning, laundry, driving, shopping, and community activity  PERSONAL FACTORS: Age, Past/current experiences, Time since onset of injury/illness/exacerbation, and 3+ comorbidities: (Hx of large B-cell lymphoma, BPH, HTN, HLD, CAD and cardiomyopathy)  are also affecting patient's functional outcome.   REHAB POTENTIAL: Good  CLINICAL DECISION MAKING: Evolving/moderate complexity  EVALUATION COMPLEXITY: Moderate   GOALS: Goals reviewed with patient? Yes  SHORT TERM GOALS: Target date: 08/11/2024  Pt will be independent with HEP in order to improve strength and decrease back pain to improve pain-free function at home and work. Baseline: 07/20/24: Baseline HEP initiated, MDT home exercise and addition of hamstrings stretching to be reviewed on visit #2.   Goal status: INITIAL   LONG TERM GOALS: Target date: 09/02/2024  Patient will have full thoracolumbar AROM without reproduction of pain as needed for reaching items on ground, household chores, bending. Baseline: 07/20/24: Motion loss and pain with flexion/extension and R lateral flexion. Goal status: INITIAL  2.  Pt will decrease worst back pain by at least 2 points on the NPRS in order to demonstrate clinically significant reduction in back pain. Baseline: 07/20/24: 10/10 at worst.  Goal  status: INITIAL  3.  Pt will decrease mODI score by at least 13 points in order demonstrate clinically significant reduction in back pain/disability.       Baseline: 07/20/24: 19/50 = 38%.    Goal status: INITIAL  4.  Patient will perform sit to stand with no UE support and no reproduction of pain indicative of improved ability to transfer and functional LE strength  Baseline: 07/20/24: Heavy UE support and difficulty with sit to stand.  Goal status: INITIAL   PLAN: PT FREQUENCY: 1-2x/week  PT DURATION: 6 weeks  PLANNED INTERVENTIONS: Therapeutic exercises, Therapeutic activity, Neuromuscular re-education, Balance training, Gait training, Patient/Family education, Self Care, Joint mobilization, Joint manipulation, Vestibular training, Canalith repositioning, Orthotic/Fit training, DME instructions, Dry Needling, Electrical stimulation, Spinal manipulation, Spinal mobilization, Cryotherapy, Moist heat, Taping, Traction, Ultrasound, Ionotophoresis 4mg /ml Dexamethasone, Manual therapy, and Re-evaluation.  PLAN FOR NEXT SESSION: EIL (sustained versus repeated movement, caution for osteoporosis). Continue with MDT as indicated and hamstrings stretching. Progress graded movement as tolerated.    Venetia Endo, PT, DPT #[id number]  Venetia ONEIDA Endo, PT 08/12/2024, 3:15 PM

## 2024-08-15 ENCOUNTER — Other Ambulatory Visit: Payer: Self-pay | Admitting: Cardiovascular Disease

## 2024-08-16 ENCOUNTER — Ambulatory Visit: Admitting: Physical Therapy

## 2024-08-16 DIAGNOSIS — M79605 Pain in left leg: Secondary | ICD-10-CM

## 2024-08-16 DIAGNOSIS — M5459 Other low back pain: Secondary | ICD-10-CM

## 2024-08-16 DIAGNOSIS — M6281 Muscle weakness (generalized): Secondary | ICD-10-CM

## 2024-08-16 NOTE — Therapy (Unsigned)
 OUTPATIENT PHYSICAL THERAPY TREATMENT   Patient Name: Ryan Roth MRN: [id number] DOB:[birth year], 68 y.o., male Today's Date: 08/16/2024  END OF SESSION:  PT End of Session - 08/16/24 1414     Visit Number 7    Number of Visits 13    Date for PT Re-Evaluation 08/31/24    Authorization Type [employer] Medicare    PT Start Time 1415    PT Stop Time 1457    PT Time Calculation (min) 42 min    Activity Tolerance Patient tolerated treatment well    Behavior During Therapy WFL for tasks assessed/performed           Past Medical History:  Diagnosis Date   Arthritis    Complication of anesthesia    BP bottomed out during septoplasty   Coronary artery disease    Dental bridge present    lower right   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   Hypertension    Hyperthyroidism 2000   Radioactive Iodine Treatments   Hypothyroidism    Lumbar stenosis    Myocardial infarction (HCC)    2012, 2014   Thyroid disease    Wears hearing aid in both ears    Has, does not wear   Past Surgical History:  Procedure Laterality Date   APPENDECTOMY     BALLOON DILATION N/A 02/21/2020   Procedure: BALLOON DILATION;  Surgeon: Jinny Carmine, MD;  Location: [facility];  Service: Endoscopy;  Laterality: N/A;   CORONARY ANGIOPLASTY WITH STENT PLACEMENT  2012   ESOPHAGOGASTRODUODENOSCOPY (EGD) WITH PROPOFOL  N/A 02/21/2020   Procedure: ESOPHAGOGASTRODUODENOSCOPY (EGD) WITH PROPOFOL ;  Surgeon: Jinny Carmine, MD;  Location: [facility];  Service: Endoscopy;  Laterality: N/A;   RIGHT/LEFT HEART CATH AND CORONARY ANGIOGRAPHY Bilateral 08/26/2023   Procedure: RIGHT/LEFT HEART CATH AND CORONARY ANGIOGRAPHY;  Surgeon: Mady Bruckner, MD;  Location: [facility];  Service: Cardiovascular;  Laterality: Bilateral;   SEPTOPLASTY  09/04/2018   [facility]   SPLENECTOMY  1990   Patient Active Problem List   Diagnosis Date Noted   Orthostatic hypotension 12/16/2023   Chronic heart failure  with mildly reduced ejection fraction (HFmrEF, 41-49%) (HCC) 10/21/2023   Orthostatic lightheadedness 10/21/2023   Nonischemic cardiomyopathy (HCC) 08/26/2023   Shortness of breath 08/26/2023   Chronic fatigue 06/11/2023   Unintentional weight loss 06/11/2023   History of B-cell lymphoma 06/11/2023   Dysphagia    Stricture and stenosis of esophagus    Hypothyroid 01/13/2020   Sensorineural hearing loss (SNHL), bilateral 01/13/2020   History of splenectomy 05/04/2019   Acute recurrent maxillary sinusitis 01/26/2019   Osteoarthritis of left hip 09/14/2018   Cubital tunnel syndrome, bilateral 07/10/2018   Nasal obstruction 06/09/2018   Carpal tunnel syndrome on both sides 03/04/2018   S/P coronary artery stent placement 12/05/2017   History of nonmelanoma skin cancer 04/14/2017   Obesity with body mass index greater than 30 04/14/2017   Atypical nevi 08/27/2016   Tubular adenoma of colon 09/27/2015   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 05/12/2015   Coronary artery disease involving native coronary artery of native heart without angina pectoris 09/01/2014   Hypertrophy of prostate without urinary obstruction and other lower urinary tract symptoms (LUTS) 09/01/2014   Immune thrombocytopenic purpura (HCC) 09/01/2014   Presence of left artificial knee joint 01/04/2014   Chest pain 12/22/2012   Localized osteoarthrosis, lower leg 11/02/2012   Osteoarthritis of left knee 11/02/2012   Essential hypertension 02/28/2012   Hyperlipidemia  LDL goal <70 02/28/2012    PCP: Lemon Lamar Flavors, MD  REFERRING PROVIDER: Jacklyn Rosine Lucas JINNY*  REFERRING DIAG: F48.73 (ICD-10-CM) - Other intervertebral disc displacement, lumbar region   RATIONALE FOR EVALUATION AND TREATMENT: Rehabilitation  THERAPY DIAG: Other low back pain  Pain in left leg  Muscle weakness (generalized)  ONSET DATE: Chronic, worsening last couple of years  FOLLOW-UP APPT SCHEDULED WITH REFERRING  PROVIDER: Yes ; f/u this Friday 07/23/24  PERTINENT HISTORY: Pt is a 68 year old male referred for low back pain and LLE radiating symptoms. His pain has gotten worse over the last couple years. 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 He previously saw Dr. Michiel who discussed his multiple arthritic changes. He reports he occasionally gets pain going down his leg but the majority of his pain in his back. His back pain is worse with bending forward and trying to get back up, reports he is also now getting numbness in his bilateral feet and has noted some swelling in his left leg. He has neuropathy R>L foot and they feel notably cold/numb. Neurosurgeon started pt on Lyrica.   Pt first saw neurosurgery in 2019 with episodes of locked up back and notable difficulty with ROM. Pt reports being more sedentary with COVID and had Sx/chemo related to lymphoma.  Pt reports bilateral hip pain that can impede walking, sometimes it hurts more on one side versus other. Previous diagnoses of trochanteric bursitis.  Pt reports occasional disturbed sleep largely from lateral hip pain - he usually sleeps on side.  Pt reports bad fall directly onto R ileum this past September; X-ray to rule out fracture. Notable ecchymosis and pain from this.  Pt had episode of severe L leg swelling and edema without explanation.    PAIN:    Pain Intensity: Present: 3-4/10, Best: 2/10, Worst: 10/10 Pain location: Pain in R>L flank/longissimus region; sciatic-type pain L>RLE that is intermittent, constant soreness in thighs  Pain Quality: aching, constant Radiating: No  Numbness/Tingling: Yes; bilateral feet with known neuropathy Focal Weakness: yes; prolonged difficulty with LE weakness and challenge with prolonged standing  Aggravating factors: bend at waist, sit to stand Relieving factors: Ibuprofen 24-hour pain  behavior: None How long can you sit: within 5 minutes, pt feels notable discomfort  History of prior back injury, pain, surgery, or therapy: Yes; known R hip AVN, Hx of 3 TKAs   Imaging: Yes ;  IMPRESSION: 1. Progression of multilevel degenerative findings with increased L4-L5 severe central spinal canal narrowing. Moderate to severe bilateral foraminal narrowing at this level. 2. L2-L3 increased moderate central spinal canal narrowing with severe right and moderate left foraminal narrowing. 3. L5-S1 moderate to severe left lateral recess narrowing secondary to bulky facet osteophytes. 4. T2 hyperintense left lower pole renal lesion seen to better advantage on the current exam, increased in size. Recommend follow-up renal ultrasound for further evaluation if not performed previously. 5. T1 and T2 hypointense right iliac lesion is partially seen and better characterized on contemporaneous MRI pelvis. 6. L4 vertebral body marrow lesion is similar in size, morphology, and signal characteristics in comparison to the prior MRI lumbar spine. 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    Red flags: Negative for bowel/bladder changes, saddle paresthesia, personal history of cancer (clear last 5 years), h/o spinal tumors, h/o compression fx, h/o abdominal aneurysm, abdominal pain, chills/fever, night sweats, nausea, vomiting,  unrelenting pain, first onset of insidious LBP <20 y/o  -BPH heightened urgency   PRECAUTIONS: Other: osteoporosis  WEIGHT BEARING RESTRICTIONS: No  FALLS: Has patient fallen in last 6 months? No  Living Environment Lives with: lives with their spouse Lives in: House/apartment Stairs: 2 steps to enter home Has following equipment at home: None  Prior level of function: Independent  Occupational demands: Technical sales engineer - retired   Presenter, broadcasting: tending to dog, Naval architect; fly fishing  Patient Goals: Less pain, more flexibility     OBJECTIVE (baseline initial evaluation measures unless otherwise noted):  Patient Surveys  Modified ODI: 19/50 = 38%  GAIT: Distance walked: 40 ft Assistive device utilized: None Level of assistance: Complete Independence Comments: Pelvic drop L>R, mild LLE antalgic pattern/dec stance time   Posture: Lumbar lordosis: Decreased Iliac crest height: Equal bilaterally Lumbar lateral shift: Negative  AROM AROM (Normal range in degrees) AROM  07/20/24  Lumbar   Flexion (65) 75% (R flank)  Extension (30) 75% (mild pain, not as much as going forward)  Right lateral flexion (25) 75%* (pain R flank)  Left lateral flexion (25) WNL  Right rotation (30) WNL  Left rotation (30) WNL      Hip Right Left  Flexion (125) WNL* WNL  Extension (15)    Abduction (40) 40 40  Adduction     Internal Rotation (45) 20 30  External Rotation (45) 45 45      (* = pain; Blank rows = not tested)   LE MMT: MMT (out of 5) Right 07/20/24 Left 07/20/24  Hip flexion 4+* (R hip) 5  Hip extension (updated 07/22/24) 4+ 4-  Hip abduction (seated) 5 5  Hip adduction    Hip internal rotation    Hip external rotation    Knee flexion 5 5  Knee extension 5 5  Ankle dorsiflexion 4 5  Ankle plantarflexion    Ankle inversion    Ankle eversion    (* = pain; Blank rows = not tested)  Sensation Dec light touch around L medial/lateral malleoli and bilat dorsal foot/toes  Reflexes R/L Knee Jerk (L3/4): 2+/2+  Ankle Jerk (S1/2): Unable to obtain  Hoffman's sign: negative Clonus: negative   Muscle Length Hamstrings: R: Positive L: Positive Ely (quadriceps): R: Positive L: Positive  Palpation Location Right Left         Lumbar paraspinals 1 1  Quadratus Lumborum    Gluteus Maximus 1 1  Gluteus Medius 1 1  Deep hip external rotators 1 1  PSIS 0 0  Fortin's Area (SIJ) 0 0  Greater Trochanter 1 1  (Blank rows = not tested) Graded on 0-4 scale (0 = no pain, 1 = pain, 2 = pain with  wincing/grimacing/flinching, 3 = pain with withdrawal, 4 = unwilling to allow palpation)  Passive Accessory Intervertebral Motion Generally, hypomobile throughout mid to lower lumbar spine.  Special Tests Lumbar Radiculopathy and Discogenic: Centralization and Peripheralization (SN 92, -LR 0.12): Not examined Slump (SN 83, -LR 0.32): R: Negative L: Negative SLR (SN 92, -LR 0.29): R: Negative L:  Negative Crossed SLR (SP 90): R: Negative L: Negative  Facet Joint: Extension-Rotation (SN 100, -LR 0.0): R: Not examined L: Not examined  Lumbar Foraminal Stenosis: Lumbar quadrant (SN 70): R: Positive L: Negative  Hip (updated 07/22/24): FABER (SN 81): R: Positive L: Positive Hip scour (SN 50): R: Positive L: Positive  Piriformis Syndrome: PACE sign: Negative     TODAY'S TREATMENT: DATE: 08/16/2024   SUBJECTIVE STATEMENT:  Patient reports 4/10 pain at arrival to PT; right of center pain at arrival. Pt denies notable LLE pain recently.   AROM Lumbar flexion 75% * (board pain across back, R ilieum)  Lumbar extension WNL * (mild/pull pain at waist) Lateral flexion: R 75%*, L WNL Thoracolumbar rotation: R WNL, L WNL    Manual Therapy - soft tissue sensitivity and mobility, joint mobility, ROM    In prone with pt propped on elbows: CPA, gr III; 3 x 30 sec   Neuromuscular Re-education - for pain modulation, desensitization   STM/DTM and IASTM with Hypervolt along R>L L4-S1 lumbar paraspinals and L gluteal musculature x 15 minutes    Therapeutic Exercise - for improved soft tissue flexibility and extensibility as needed for ROM, improved strength as needed to improve performance of CKC activities/functional movements   Prone alternating hip extension;  2x 10 alt R/L  Supine lower trunk rotations; 1 x 10 alt R/L  Supine pelvic tilt, focus on anterior tilt; 2 x 10  Sit to stand with increased table height to limit closed-chain knee flexion, upright trunk; 2 x  10  PATIENT EDUCATION: HEP update and review. New Med[employer] handout provided.   *not today* Prone on elbows; x 3 min;  Bridge; 1 x 10 Seated piriformis stretch; 3 x 30 sec, bilat Supine piriformis stretch attempted, back pain - discontinued Seated hamstrings stretch; 2 x 30 sec on each side    PATIENT EDUCATION:  Education details: see above for patient education details Person educated: Patient Education method: Explanation, Demonstration, and Handouts Education comprehension: verbalized understanding and returned demonstration   HOME EXERCISE PROGRAM:  Access Code: [email or web address] URL: [email or web address] Date: 08/16/2024 Prepared by: Venetia Endo  Exercises - Static Prone on Elbows  - 3-4 x daily - 7 x weekly - 1 sets - hold - Supine Lower Trunk Rotation  - 2 x daily - 7 x weekly - 2 sets - 10 reps - 2-3sec hold - Supine Piriformis Stretch with Foot on Ground  - 2 x daily - 7 x weekly - 3 sets - 30sec hold - Seated Hamstring Stretch  - 2 x daily - 7 x weekly - 3 sets - 30sec hold - Prone Hip Extension  - 1 x daily - 4 x weekly - 2 sets - 10 reps - Supine Pelvic Tilt  - 1 x daily - 4 x weekly - 2 sets - 10 reps - Sit to Stand Without Arm Support  - 1 x daily - 4 x weekly - 2 sets - 10 reps   ASSESSMENT:  CLINICAL IMPRESSION: Patient's HEP was updated today due [place] and having lapse in PT visits until late September. Patient fortunately has no notable LE symptoms at this time. He has ongoing pain affecting axial lower lumbar region and lumbar flank with variability in R versus L-sided symptoms. Pt has fair response with manual therapy; we progressed with active intervention today given relatively stable NPRS. Pt is awaiting f/u with pain management for diagnostic nerve block and likely nerve ablation. Pt has current deficits in thoracolumbar AROM, quad/HS flexibility, back pain with largely LLE referred pain, L-spine stiffness, and  gait changes. Pt will continue to benefit from skilled PT services to address deficits and improve function.   OBJECTIVE IMPAIRMENTS: Abnormal gait, difficulty walking, decreased ROM, decreased strength, hypomobility, impaired flexibility, postural dysfunction, and pain.   ACTIVITY LIMITATIONS: lifting, bending, sitting, squatting, sleeping, transfers, and bed mobility  PARTICIPATION LIMITATIONS: meal  prep, cleaning, laundry, driving, shopping, and community activity  PERSONAL FACTORS: Age, Past/current experiences, Time since onset of injury/illness/exacerbation, and 3+ comorbidities: (Hx of large B-cell lymphoma, BPH, HTN, HLD, CAD and cardiomyopathy)  are also affecting patient's functional outcome.   REHAB POTENTIAL: Good  CLINICAL DECISION MAKING: Evolving/moderate complexity  EVALUATION COMPLEXITY: Moderate   GOALS: Goals reviewed with patient? Yes  SHORT TERM GOALS: Target date: 08/11/2024  Pt will be independent with HEP in order to improve strength and decrease back pain to improve pain-free function at home and work. Baseline: 07/20/24: Baseline HEP initiated, MDT home exercise and addition of hamstrings stretching to be reviewed on visit #2.   Goal status: INITIAL   LONG TERM GOALS: Target date: 09/02/2024  Patient will have full thoracolumbar AROM without reproduction of pain as needed for reaching items on ground, household chores, bending. Baseline: 07/20/24: Motion loss and pain with flexion/extension and R lateral flexion. Goal status: INITIAL  2.  Pt will decrease worst back pain by at least 2 points on the NPRS in order to demonstrate clinically significant reduction in back pain. Baseline: 07/20/24: 10/10 at worst.  Goal status: INITIAL  3.  Pt will decrease mODI score by at least 13 points in order demonstrate clinically significant reduction in back pain/disability.       Baseline: 07/20/24: 19/50 = 38%.    Goal status: INITIAL  4.  Patient will perform sit to  stand with no UE support and no reproduction of pain indicative of improved ability to transfer and functional LE strength  Baseline: 07/20/24: Heavy UE support and difficulty with sit to stand.  Goal status: INITIAL   PLAN: PT FREQUENCY: 1-2x/week  PT DURATION: 6 weeks  PLANNED INTERVENTIONS: Therapeutic exercises, Therapeutic activity, Neuromuscular re-education, Balance training, Gait training, Patient/Family education, Self Care, Joint mobilization, Joint manipulation, Vestibular training, Canalith repositioning, Orthotic/Fit training, DME instructions, Dry Needling, Electrical stimulation, Spinal manipulation, Spinal mobilization, Cryotherapy, Moist heat, Taping, Traction, Ultrasound, Ionotophoresis 4mg /ml Dexamethasone, Manual therapy, and Re-evaluation.  PLAN FOR NEXT SESSION: EIL (sustained versus repeated movement, caution for osteoporosis). Continue with MDT as indicated and hamstrings stretching. Progress graded movement as tolerated.    Venetia Endo, PT, DPT #[id number]  Venetia ONEIDA Endo, PT 08/16/2024, 2:15 PM

## 2024-08-18 ENCOUNTER — Encounter: Payer: Self-pay | Admitting: Physical Therapy

## 2025-02-09 ENCOUNTER — Ambulatory Visit: Admitting: Internal Medicine
